# Patient Record
Sex: Male | Born: 1955
Health system: Southern US, Community
[De-identification: ages and names within clinical notes are randomized; demographics above are authoritative.]

## PROBLEM LIST (undated history)

## (undated) DIAGNOSIS — T4145XA Adverse effect of unspecified anesthetic, initial encounter: Secondary | ICD-10-CM

## (undated) DIAGNOSIS — D649 Anemia, unspecified: Secondary | ICD-10-CM

## (undated) DIAGNOSIS — Z8719 Personal history of other diseases of the digestive system: Secondary | ICD-10-CM

## (undated) DIAGNOSIS — G5601 Carpal tunnel syndrome, right upper limb: Secondary | ICD-10-CM

## (undated) DIAGNOSIS — Z8489 Family history of other specified conditions: Secondary | ICD-10-CM

## (undated) DIAGNOSIS — I10 Essential (primary) hypertension: Secondary | ICD-10-CM

## (undated) DIAGNOSIS — K219 Gastro-esophageal reflux disease without esophagitis: Secondary | ICD-10-CM

## (undated) DIAGNOSIS — M169 Osteoarthritis of hip, unspecified: Secondary | ICD-10-CM

## (undated) DIAGNOSIS — Z8619 Personal history of other infectious and parasitic diseases: Secondary | ICD-10-CM

## (undated) DIAGNOSIS — G5621 Lesion of ulnar nerve, right upper limb: Secondary | ICD-10-CM

## (undated) DIAGNOSIS — J45909 Unspecified asthma, uncomplicated: Secondary | ICD-10-CM

## (undated) DIAGNOSIS — E119 Type 2 diabetes mellitus without complications: Secondary | ICD-10-CM

## (undated) DIAGNOSIS — Z87442 Personal history of urinary calculi: Secondary | ICD-10-CM

## (undated) DIAGNOSIS — Z8711 Personal history of peptic ulcer disease: Secondary | ICD-10-CM

## (undated) HISTORY — DX: Personal history of other infectious and parasitic diseases: Z86.19

## (undated) HISTORY — PX: JOINT REPLACEMENT: SHX530

---

## 1999-01-30 DIAGNOSIS — T8859XA Other complications of anesthesia, initial encounter: Secondary | ICD-10-CM

## 1999-01-30 HISTORY — PX: SHOULDER ARTHROSCOPY: SHX128

## 1999-01-30 HISTORY — DX: Other complications of anesthesia, initial encounter: T88.59XA

## 1999-03-02 ENCOUNTER — Encounter: Admission: RE | Admit: 1999-03-02 | Discharge: 1999-03-02 | Payer: Self-pay | Admitting: Family Medicine

## 1999-03-02 ENCOUNTER — Encounter: Payer: Self-pay | Admitting: Family Medicine

## 1999-03-16 ENCOUNTER — Ambulatory Visit (HOSPITAL_COMMUNITY): Admission: RE | Admit: 1999-03-16 | Discharge: 1999-03-16 | Payer: Self-pay | Admitting: Gastroenterology

## 1999-03-17 ENCOUNTER — Encounter: Payer: Self-pay | Admitting: Gastroenterology

## 1999-03-17 ENCOUNTER — Encounter: Admission: RE | Admit: 1999-03-17 | Discharge: 1999-03-17 | Payer: Self-pay | Admitting: Gastroenterology

## 1999-11-10 ENCOUNTER — Ambulatory Visit (HOSPITAL_COMMUNITY): Admission: RE | Admit: 1999-11-10 | Discharge: 1999-11-10 | Payer: Self-pay | Admitting: Orthopedic Surgery

## 1999-11-10 ENCOUNTER — Encounter: Payer: Self-pay | Admitting: Orthopedic Surgery

## 2008-12-09 ENCOUNTER — Encounter (HOSPITAL_BASED_OUTPATIENT_CLINIC_OR_DEPARTMENT_OTHER): Admission: RE | Admit: 2008-12-09 | Discharge: 2008-12-30 | Payer: Self-pay | Admitting: Internal Medicine

## 2014-05-13 ENCOUNTER — Ambulatory Visit: Payer: Self-pay | Admitting: Orthopedic Surgery

## 2014-05-13 NOTE — Progress Notes (Signed)
Preoperative surgical orders have been place into the [facility] hospital system for Lawerance CruelWilliam R Kersh on 05/13/2014, 5:58 PM  by Patrica DuelPERKINS, Devonna Oboyle for surgery on 05-26-2014.  Preop Total Hip - Anterior Approach orders including IV Tylenol, and IV Decadron as long as there are no contraindications to the above medications. Avel Peacerew Makynzee Tigges, PA-C

## 2014-05-18 NOTE — Patient Instructions (Addendum)
Alan CruelWilliam R Butler  05/18/2014   Your procedure is scheduled on:  05/26/2014    [facility] and follow signs to               Short Stay Center at   0515 AM.  Call this number if you have problems the morning of surgery [phone]   Remember: Eat a good healthy snack prior to bedtime.   Do not eat food or drink liquids :After Midnight.     Take these medicines the morning of surgery with A SIP OF WATER:  Albuterol Inhaler if needed and bring, , Protonix                                You may not have any metal on your body including hair pins and              piercings  Do not wear jewelry,  lotions, powders or perfumes., deodorant.                            Men may shave face and neck.   Do not bring valuables to the hospital. [facility] IS NOT             RESPONSIBLE   FOR VALUABLES.  Contacts, dentures or bridgework may not be worn into surgery.  Leave suitcase in the car. After surgery it may be brought to your room.         Special Instructions: coughing and deep breathing exercises, leg exercises               Please read over the following fact sheets you were given: _____________________________________________________________________             [facility] - Preparing [facility] Before surgery, you can play an important role.  Because skin is not sterile, your skin needs to be as free of germs as possible.  You can reduce the number of germs on your skin by washing with CHG (chlorahexidine gluconate) soap before surgery.  CHG is an antiseptic cleaner which kills germs and bonds with the skin to continue killing germs even after washing. Please DO NOT use if you have an allergy to CHG or antibacterial soaps.  If your skin becomes reddened/irritated stop using the CHG and inform your nurse when you arrive at Short Stay. Do not shave (including legs and underarms) for at least 48 hours prior to the first CHG shower.  You  may shave your face/neck. Please follow these instructions carefully:  1.  Shower with CHG Soap the night before surgery and the  morning of Surgery.  2.  If you choose to wash your hair, wash your hair first as usual with your  normal  shampoo.  3.  After you shampoo, rinse your hair and body thoroughly to remove the  shampoo.                           4.  Use CHG as you would any other liquid soap.  You can apply chg directly  to the skin and wash                       Gently with  a scrungie or clean washcloth.  5.  Apply the CHG Soap to your body ONLY FROM THE NECK DOWN.   Do not use on face/ open                           Wound or open sores. Avoid contact with eyes, ears mouth and genitals (private parts).                       Wash face,  Genitals (private parts) with your normal soap.             6.  Wash thoroughly, paying special attention to the area where your surgery  will be performed.  7.  Thoroughly rinse your body with warm water from the neck down.  8.  DO NOT shower/wash with your normal soap after using and rinsing off  the CHG Soap.                9.  Pat yourself dry with a clean towel.            10.  Wear clean pajamas.            11.  Place clean sheets on your bed the night of your first shower and do not  sleep with pets. Day of Surgery : Do not apply any lotions/deodorants the morning of surgery.  Please wear clean clothes to the hospital/surgery center.  FAILURE TO FOLLOW THESE INSTRUCTIONS MAY RESULT IN THE CANCELLATION OF YOUR SURGERY PATIENT SIGNATURE_________________________________  NURSE SIGNATURE__________________________________  ________________________________________________________________________  WHAT IS A BLOOD TRANSFUSION? Blood Transfusion Information  A transfusion is the replacement of blood or some of its parts. Blood is made up of multiple cells which provide different functions.  Red blood cells carry oxygen and are used for blood loss  replacement.  White blood cells fight against infection.  Platelets control bleeding.  Plasma helps clot blood.  Other blood products are available for specialized needs, such as hemophilia or other clotting disorders. BEFORE THE TRANSFUSION  Who gives blood for transfusions?   Healthy volunteers who are fully evaluated to make sure their blood is safe. This is blood bank blood. Transfusion therapy is the safest it has ever been in the practice of medicine. Before blood is taken from a donor, a complete history is taken to make sure that person has no history of diseases nor engages in risky social behavior (examples are intravenous drug use or sexual activity with multiple partners). The donor's travel history is screened to minimize risk of transmitting infections, such as malaria. The donated blood is tested for signs of infectious diseases, such as HIV and hepatitis. The blood is then tested to be sure it is compatible with you in order to minimize the chance of a transfusion reaction. If you or a relative donates blood, this is often done in anticipation of surgery and is not appropriate for emergency situations. It takes many days to process the donated blood. RISKS AND COMPLICATIONS Although transfusion therapy is very safe and saves many lives, the main dangers of transfusion include:  1. Getting an infectious disease. 2. Developing a transfusion reaction. This is an allergic reaction to something in the blood you were given. Every precaution is taken to prevent this. The decision to have a blood transfusion has been considered carefully by your caregiver before blood is given. Blood is not given unless the benefits outweigh the risks.  AFTER THE TRANSFUSION  Right after receiving a blood transfusion, you will usually feel much better and more energetic. This is especially true if your red blood cells have gotten low (anemic). The transfusion raises the level of the red blood cells which  carry oxygen, and this usually causes an energy increase.  The nurse administering the transfusion will monitor you carefully for complications. HOME CARE INSTRUCTIONS  No special instructions are needed after a transfusion. You may find your energy is better. Speak with your caregiver about any limitations on activity for underlying diseases you may have. SEEK MEDICAL CARE IF:   Your condition is not improving after your transfusion.  You develop redness or irritation at the intravenous (IV) site. SEEK IMMEDIATE MEDICAL CARE IF:  Any of the following symptoms occur over the next 12 hours:  Shaking chills.  You have a temperature by mouth above 102 F (38.9 C), not controlled by medicine.  Chest, back, or muscle pain.  People around you feel you are not acting correctly or are confused.  Shortness of breath or difficulty breathing.  Dizziness and fainting.  You get a rash or develop hives.  You have a decrease in urine output.  Your urine turns a dark color or changes to pink, red, or brown. Any of the following symptoms occur over the next 10 days:  You have a temperature by mouth above 102 F (38.9 C), not controlled by medicine.  Shortness of breath.  Weakness after normal activity.  The white part of the eye turns yellow (jaundice).  You have a decrease in the amount of urine or are urinating less often.  Your urine turns a dark color or changes to pink, red, or brown. Document Released: 01/13/2000 Document Revised: 04/09/2011 Document Reviewed: 09/01/2007 ExitCare Patient Information 2014 [place].  _______________________________________________________________________  Incentive Spirometer  An incentive spirometer is a tool that can help keep your lungs clear and active. This tool measures how well you are filling your lungs with each breath. Taking long deep breaths may help reverse or decrease the chance of developing breathing (pulmonary) problems  (especially infection) following:  A long period of time when you are unable to move or be active. BEFORE THE PROCEDURE   If the spirometer includes an indicator to show your best effort, your nurse or respiratory therapist will set it to a desired goal.  If possible, sit up straight or lean slightly forward. Try not to slouch.  Hold the incentive spirometer in an upright position. INSTRUCTIONS FOR USE  3. Sit on the edge of your bed if possible, or sit up as far as you can in bed or on a chair. 4. Hold the incentive spirometer in an upright position. 5. Breathe out normally. 6. Place the mouthpiece in your mouth and seal your lips tightly around it. 7. Breathe in slowly and as deeply as possible, raising the piston or the ball toward the top of the column. 8. Hold your breath for 3-5 seconds or for as long as possible. Allow the piston or ball to fall to the bottom of the column. 9. Remove the mouthpiece from your mouth and breathe out normally. 10. Rest for a few seconds and repeat Steps 1 through 7 at least 10 times every 1-2 hours when you are awake. Take your time and take a few normal breaths between deep breaths. 11. The spirometer may include an indicator to show your best effort. Use the indicator as a goal to work toward during  each repetition. 12. After each set of 10 deep breaths, practice coughing to be sure your lungs are clear. If you have an incision (the cut made at the time of surgery), support your incision when coughing by placing a pillow or rolled up towels firmly against it. Once you are able to get out of bed, walk around indoors and cough well. You may stop using the incentive spirometer when instructed by your caregiver.  RISKS AND COMPLICATIONS  Take your time so you do not get dizzy or light-headed.  If you are in pain, you may need to take or ask for pain medication before doing incentive spirometry. It is harder to take a deep breath if you are having  pain. AFTER USE  Rest and breathe slowly and easily.  It can be helpful to keep track of a log of your progress. Your caregiver can provide you with a simple table to help with this. If you are using the spirometer at home, follow these instructions: [place] IF:   You are having difficultly using the spirometer.  You have trouble using the spirometer as often as instructed.  Your pain medication is not giving enough relief while using the spirometer.  You develop fever of 100.5 F (38.1 C) or higher. SEEK IMMEDIATE MEDICAL CARE IF:   You cough up bloody sputum that had not been present before.  You develop fever of 102 F (38.9 C) or greater.  You develop worsening pain at or near the incision site. MAKE SURE YOU:   Understand these instructions.  Will watch your condition.  Will get help right away if you are not doing well or get worse. Document Released: 05/28/2006 Document Revised: 04/09/2011 Document Reviewed: 07/29/2006 [facility] Patient Information 2014 [place], [place].   ________________________________________________________________________

## 2014-05-19 ENCOUNTER — Encounter (HOSPITAL_COMMUNITY): Payer: Self-pay

## 2014-05-19 ENCOUNTER — Encounter (HOSPITAL_COMMUNITY)
Admission: RE | Admit: 2014-05-19 | Discharge: 2014-05-19 | Disposition: A | Discharge status: Home / Self Care | Payer: 59 | Source: Ambulatory Visit | Attending: Orthopedic Surgery | Admitting: Orthopedic Surgery

## 2014-05-19 DIAGNOSIS — Z01812 Encounter for preprocedural laboratory examination: Secondary | ICD-10-CM | POA: Insufficient documentation

## 2014-05-19 HISTORY — DX: Gastro-esophageal reflux disease without esophagitis: K21.9

## 2014-05-19 HISTORY — DX: Family history of other specified conditions: Z84.89

## 2014-05-19 HISTORY — DX: Unspecified asthma, uncomplicated: J45.909

## 2014-05-19 HISTORY — DX: Personal history of urinary calculi: Z87.442

## 2014-05-19 HISTORY — DX: Adverse effect of unspecified anesthetic, initial encounter: T41.45XA

## 2014-05-19 HISTORY — DX: Essential (primary) hypertension: I10

## 2014-05-19 LAB — URINALYSIS, ROUTINE W REFLEX MICROSCOPIC
Bilirubin Urine: NEGATIVE
Glucose, UA: NEGATIVE mg/dL
KETONES UR: NEGATIVE mg/dL
LEUKOCYTES UA: NEGATIVE
Nitrite: NEGATIVE
Protein, ur: NEGATIVE mg/dL
Specific Gravity, Urine: 1.01 (ref 1.005–1.030)
UROBILINOGEN UA: 1 mg/dL (ref 0.0–1.0)
pH: 6 (ref 5.0–8.0)

## 2014-05-19 LAB — PROTIME-INR
INR: 0.97 (ref 0.00–1.49)
Prothrombin Time: 13 seconds (ref 11.6–15.2)

## 2014-05-19 LAB — COMPREHENSIVE METABOLIC PANEL
ALT: 19 U/L (ref 0–53)
AST: 23 U/L (ref 0–37)
Albumin: 4.6 g/dL (ref 3.5–5.2)
Alkaline Phosphatase: 62 U/L (ref 39–117)
Anion gap: 8 (ref 5–15)
BUN: 15 mg/dL (ref 6–23)
CALCIUM: 9.7 mg/dL (ref 8.4–10.5)
CO2: 28 mmol/L (ref 19–32)
Chloride: 104 mmol/L (ref 96–112)
Creatinine, Ser: 0.74 mg/dL (ref 0.50–1.35)
GLUCOSE: 158 mg/dL — AB (ref 70–99)
Potassium: 4.2 mmol/L (ref 3.5–5.1)
SODIUM: 140 mmol/L (ref 135–145)
Total Bilirubin: 1 mg/dL (ref 0.3–1.2)
Total Protein: 7.7 g/dL (ref 6.0–8.3)

## 2014-05-19 LAB — CBC
HCT: 43.2 % (ref 39.0–52.0)
Hemoglobin: 13.8 g/dL (ref 13.0–17.0)
MCH: 31.4 pg (ref 26.0–34.0)
MCHC: 31.9 g/dL (ref 30.0–36.0)
MCV: 98.2 fL (ref 78.0–100.0)
Platelets: 172 10*3/uL (ref 150–400)
RBC: 4.4 MIL/uL (ref 4.22–5.81)
RDW: 13.1 % (ref 11.5–15.5)
WBC: 6.2 10*3/uL (ref 4.0–10.5)

## 2014-05-19 LAB — URINE MICROSCOPIC-ADD ON

## 2014-05-19 LAB — SURGICAL PCR SCREEN
MRSA, PCR: POSITIVE — AB
Staphylococcus aureus: POSITIVE — AB

## 2014-05-19 LAB — APTT: APTT: 31 s (ref 24–37)

## 2014-05-19 NOTE — Progress Notes (Signed)
U/A with micro results done 05/19/14 faxed via EPIC to Dr Aluisio.   

## 2014-05-19 NOTE — Progress Notes (Signed)
EKG- 03/31/14 on chart  Clearance- Dr Tomi BambergerSusan Fuller 04/09/14 on chart along with LOV- 03/31/14 on chart.

## 2014-05-23 ENCOUNTER — Ambulatory Visit: Payer: Self-pay | Admitting: Orthopedic Surgery

## 2014-05-23 NOTE — H&P (Signed)
Alan Butler DOB: 10/21/1955 Married / Language: English / Race: White Male Date of Admission:  05/26/2014 CC:  Right Hip Pain History of Present Illness The patient is a 58 year old male who comes in  for a preoperative History and Physical. The patient is scheduled for a right total hip arthroplasty (anterior) to be performed by Dr. Frank V. Aluisio, MD at [facility] on 05-26-2014. The patient is a 58 year old male who presents with a hip problem. The patient was seen for a second opinion.The patient reports right hip problems including pain and osteoarthritis symptoms that have been present for year(s). The symptoms began without any known injury. Prior to being seen today the patient was previously evaluated by a colleague (by Dr. Wainer) 10 year(s) ago. Symptoms present at the patient's previous evaluation included pain in the hip. Previous workup for this problem has included pelvic x-rays, hip x-rays and hip MRI. He is having pain and decreased ROM. When he was seen by Dr. Wainer 10 years ago he was told that he had some OA in hie right hip but that he was too young for THA. He was placed on meloxicam which his PCP quickly took him off of. He takes Aleve currently with minimal benefit. The pain has gotten progressively worse over the years. He has pain at all times but worse with weightbearing. His pain is in the groin and lateral right thigh, and radiates to the knee. 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 He is ready to have the hip replaced. Unfortunately, the right hip has gotten to the point where it is hurting him at all times. It is limiting what he can and cannot do. He has been told many years ago that he would eventually need to have the hip replaced but now he's at a stage where he cannot tolerate the pain anymore. It is affecting him day and night. It's affecting his ability to work. He is ready to proceed with  surgery. They have been treated conservatively in the past for the above stated problem and despite conservative measures, they continue to have progressive pain and severe functional limitations and dysfunction. They have failed non-operative management including home exercise, medications, and injections. It is felt that they would benefit from undergoing total joint replacement. Risks and benefits of the procedure have been discussed with the patient and they elect to proceed with surgery. There are no active contraindications to surgery such as ongoing infection or rapidly progressive neurological disease.  Problem List/Past Medical Primary osteoarthritis of right hip (M16.11) Hypercholesterolemia Skin Cancer Diabetes Mellitus, Type II Gastroesophageal Reflux Disease High blood pressure Allergic Urticaria Asthma Pneumonia Past History Hemorrhoids Kidney Stone Cellulitis Past History Staphlococcus Infections around 59 years old  Allergies No Known Drug Allergies  Family History  Congestive Heart Failure mother Diabetes Mellitus mother, grandmother mothers side and grandfather mothers side Heart Disease mother, grandmother mothers side and grandfather mothers side Cerebrovascular Accident grandfather fathers side Hypertension mother  Social History Children 1 Current work status working full time Drug/Alcohol Rehab (Currently) no Alcohol use current drinker; only occasionally per week Drug/Alcohol Rehab (Previously) no Pain Contract no Tobacco use never smoker Illicit drug use no Living situation live with spouse Marital status married Advance Directives Living Will, Healthcare POA  Medication History  Multivitamin (Oral) Active. MetFORMIN HCl (1000MG Tablet, Oral two times daily) Active. Fosinopril Sodium (40MG Tablet, Oral daily)   Active. Pantoprazole Sodium (40MG Tablet DR, Oral two times daily) Active. Simvastatin (40MG Tablet, Oral  at bedtime) Active. Naproxen Sodium (220MG Tablet, Oral as needed) Active. Aspirin EC (81MG Tablet DR, Oral daily) Active. ZyrTEC Allergy (10MG Tablet, Oral daily) Active. (generic brand from [employer]) Clobetasol Propionate (0.05% Cream, External as needed) Active.  Past Surgical History  Arthroscopy of Shoulder right Tonsillectomy  Review of Systems General Not Present- Chills, Fatigue, Fever, Memory Loss, Night Sweats, Weight Gain and Weight Loss. Skin Not Present- Eczema, Hives, Itching, Lesions and Rash. HEENT Not Present- Dentures, Double Vision, Headache, Hearing Loss, Tinnitus and Visual Loss. Respiratory Not Present- Allergies, Chronic Cough, Coughing up blood, Shortness of breath at rest and Shortness of breath with exertion. Cardiovascular Not Present- Chest Pain, Difficulty Breathing Lying Down, Murmur, Palpitations, Racing/skipping heartbeats and Swelling. Gastrointestinal Present- Diarrhea (attributed to the metformin). Not Present- Abdominal Pain, Bloody Stool, Constipation, Difficulty Swallowing, Heartburn, Jaundice, Loss of appetitie, Nausea and Vomiting. Male Genitourinary Not Present- Blood in Urine, Discharge, Flank Pain, Incontinence, Painful Urination, Urgency, Urinary frequency, Urinary Retention, Urinating at Night and Weak urinary stream. Musculoskeletal Not Present- Back Pain, Joint Pain, Joint Swelling, Morning Stiffness, Muscle Pain, Muscle Weakness and Spasms. Neurological Not Present- Blackout spells, Difficulty with balance, Dizziness, Paralysis, Tremor and Weakness. Psychiatric Not Present- Insomnia.  Vitals Weight: 189 lb Height: 70in Weight was reported by patient. Height was reported by patient. Body Surface Area: 2.04 m Body Mass Index: 27.12 kg/m  BP: 122/78 (Sitting, Right Arm, Standard)  Physical Exam  General Mental Status -Alert, cooperative and good historian. General Appearance-pleasant, Not in acute  distress. Orientation-Oriented X3. Build & Nutrition-Well nourished and Well developed.  Head and Neck Head-normocephalic, atraumatic . Neck Global Assessment - supple, no bruit auscultated on the right, no bruit auscultated on the left.  Eye Vision-Wears corrective lenses. Pupil - Bilateral-Regular and Round. Motion - Bilateral-EOMI.  Chest and Lung Exam Auscultation Breath sounds - clear at anterior chest wall and clear at posterior chest wall. Adventitious sounds - No Adventitious sounds.  Cardiovascular Auscultation Rhythm - Regular rate and rhythm. Heart Sounds - S1 WNL and S2 WNL. Murmurs & Other Heart Sounds - Auscultation of the heart reveals - No Murmurs.  Abdomen Palpation/Percussion Tenderness - Abdomen is non-tender to palpation. Rigidity (guarding) - Abdomen is soft. Auscultation Auscultation of the abdomen reveals - Bowel sounds normal.  Male Genitourinary Note: Not done, not pertinent to present illness   Musculoskeletal Note: On exam, well-developed male, A&O, in no apparent distress. His left hip has normal motion without discomfort. Right hip has flexion to 80, no internal rotation, minimal external rotation, only about 15 degrees of abduction. There is pain on all ranges of motion. His knee shows no effusion, range about 0-125 with no tenderness or instability. Pulses, sensation and motor are intact distally. He has a significantly antalgic gait pattern on the right.  RADIOGRAPHS: AP pelvis and lateral of the right hip show that he has advanced end stage arthritis, bone on bone throughout with subchondral cystic formation and large osteophytes. Left hip has slight degenerative change.   Assessment & Plan Primary osteoarthritis of right hip (M16.11) Note:Surgical Plans: Right Total Hip Replacement - Anterior Approach  Disposition: HOME  PCP: Susan Fuller, NP - Patient has been seen preoperatively and felt to be stable for  surgery.  IV TXA  Anesthesia Issues: Difficult to wake up following the shoulder surgery.  Signed electronically by Leea Rambeau L Leotta Weingarten, III PA-C 

## 2014-05-25 MED ORDER — CEFAZOLIN SODIUM-DEXTROSE 2-3 GM-% IV SOLR: 2.0000 g | INTRAVENOUS | Status: DC

## 2014-05-25 NOTE — Anesthesia Preprocedure Evaluation (Addendum)
Anesthesia Evaluation  Patient identified by MRN, date of birth, ID band Patient awake    Reviewed: Allergy & Precautions, H&P , NPO status , Patient's Chart, lab work & pertinent test results  Airway Mallampati: II  TM Distance: >3 FB Neck ROM: full    Dental no notable dental hx. (+) Teeth Intact, Dental Advisory Given   Pulmonary neg pulmonary ROS,  breath sounds clear to auscultation  Pulmonary exam normal       Cardiovascular Exercise Tolerance: Good hypertension, Pt. on medications Rhythm:regular Rate:Normal     Neuro/Psych negative neurological ROS  negative psych ROS   GI/Hepatic negative GI ROS, Neg liver ROS, GERD-  Medicated and Controlled,  Endo/Other  diabetes, Well Controlled, Type 2, Oral Hypoglycemic Agents  Renal/GU negative Renal ROS  negative genitourinary   Musculoskeletal   Abdominal   Peds  Hematology negative hematology ROS (+)   Anesthesia Other Findings   Reproductive/Obstetrics negative OB ROS                            Anesthesia Physical Anesthesia Plan  ASA: III  Anesthesia Plan: General   Post-op Pain Management:    Induction: Intravenous  Airway Management Planned: Oral ETT  Additional Equipment:   Intra-op Plan:   Post-operative Plan: Extubation in OR  Informed Consent: I have reviewed the patients History and Physical, chart, labs and discussed the procedure including the risks, benefits and alternatives for the proposed anesthesia with the patient or authorized representative who has indicated his/her understanding and acceptance.   Dental Advisory Given  Plan Discussed with: CRNA and Surgeon  Anesthesia Plan Comments:         Anesthesia Quick Evaluation  

## 2014-05-26 ENCOUNTER — Inpatient Hospital Stay (HOSPITAL_COMMUNITY): Payer: 59

## 2014-05-26 ENCOUNTER — Inpatient Hospital Stay (HOSPITAL_COMMUNITY): Payer: 59 | Admitting: Anesthesiology

## 2014-05-26 ENCOUNTER — Encounter (HOSPITAL_COMMUNITY): Payer: Self-pay | Admitting: *Deleted

## 2014-05-26 ENCOUNTER — Encounter (HOSPITAL_COMMUNITY)
Admission: RE | Disposition: A | Discharge status: Home / Self Care | Payer: Self-pay | Source: Ambulatory Visit | Attending: Orthopedic Surgery

## 2014-05-26 ENCOUNTER — Inpatient Hospital Stay (HOSPITAL_COMMUNITY)
Admission: RE | Admit: 2014-05-26 | Discharge: 2014-05-28 | DRG: 470 | Disposition: A | Discharge status: Home / Self Care | Payer: 59 | Source: Ambulatory Visit | Attending: Orthopedic Surgery | Admitting: Orthopedic Surgery

## 2014-05-26 DIAGNOSIS — M1611 Unilateral primary osteoarthritis, right hip: Secondary | ICD-10-CM | POA: Diagnosis present

## 2014-05-26 DIAGNOSIS — Z85828 Personal history of other malignant neoplasm of skin: Secondary | ICD-10-CM | POA: Diagnosis not present

## 2014-05-26 DIAGNOSIS — E780 Pure hypercholesterolemia: Secondary | ICD-10-CM | POA: Diagnosis present

## 2014-05-26 DIAGNOSIS — E119 Type 2 diabetes mellitus without complications: Secondary | ICD-10-CM | POA: Diagnosis present

## 2014-05-26 DIAGNOSIS — J45909 Unspecified asthma, uncomplicated: Secondary | ICD-10-CM | POA: Diagnosis present

## 2014-05-26 DIAGNOSIS — I10 Essential (primary) hypertension: Secondary | ICD-10-CM | POA: Diagnosis present

## 2014-05-26 DIAGNOSIS — L500 Allergic urticaria: Secondary | ICD-10-CM | POA: Diagnosis present

## 2014-05-26 DIAGNOSIS — K219 Gastro-esophageal reflux disease without esophagitis: Secondary | ICD-10-CM | POA: Diagnosis present

## 2014-05-26 DIAGNOSIS — M25551 Pain in right hip: Secondary | ICD-10-CM | POA: Diagnosis present

## 2014-05-26 DIAGNOSIS — Z96649 Presence of unspecified artificial hip joint: Secondary | ICD-10-CM

## 2014-05-26 DIAGNOSIS — N200 Calculus of kidney: Secondary | ICD-10-CM | POA: Diagnosis present

## 2014-05-26 DIAGNOSIS — M169 Osteoarthritis of hip, unspecified: Secondary | ICD-10-CM | POA: Diagnosis present

## 2014-05-26 DIAGNOSIS — Z87442 Personal history of urinary calculi: Secondary | ICD-10-CM | POA: Diagnosis not present

## 2014-05-26 HISTORY — PX: TOTAL HIP ARTHROPLASTY: SHX124

## 2014-05-26 LAB — GLUCOSE, CAPILLARY
GLUCOSE-CAPILLARY: 128 mg/dL — AB (ref 70–99)
GLUCOSE-CAPILLARY: 148 mg/dL — AB (ref 70–99)
Glucose-Capillary: 144 mg/dL — ABNORMAL HIGH (ref 70–99)
Glucose-Capillary: 160 mg/dL — ABNORMAL HIGH (ref 70–99)

## 2014-05-26 LAB — ABO/RH: ABO/RH(D): O POS

## 2014-05-26 LAB — TYPE AND SCREEN
ABO/RH(D): O POS
Antibody Screen: NEGATIVE

## 2014-05-26 SURGERY — ARTHROPLASTY, HIP, TOTAL, ANTERIOR APPROACH
Anesthesia: General | Laterality: Right

## 2014-05-26 MED ORDER — ONDANSETRON HCL 4 MG/2ML IJ SOLN
4.0000 mg | Freq: Four times a day (QID) | INTRAMUSCULAR | Status: DC | PRN
  Administered 2014-05-27: 4 mg via INTRAVENOUS
  Filled 2014-05-26: qty 2

## 2014-05-26 MED ORDER — NEOSTIGMINE METHYLSULFATE 10 MG/10ML IV SOLN
INTRAVENOUS | Status: AC
  Filled 2014-05-26: qty 1

## 2014-05-26 MED ORDER — GLYCOPYRROLATE 0.2 MG/ML IJ SOLN
INTRAMUSCULAR | Status: DC | PRN
  Administered 2014-05-26: .8 mg via INTRAVENOUS

## 2014-05-26 MED ORDER — KETOROLAC TROMETHAMINE 15 MG/ML IJ SOLN: 7.5000 mg | Freq: Four times a day (QID) | INTRAMUSCULAR | Status: AC | PRN

## 2014-05-26 MED ORDER — MORPHINE SULFATE 2 MG/ML IJ SOLN
1.0000 mg | INTRAMUSCULAR | Status: DC | PRN
  Administered 2014-05-26: 2 mg via INTRAVENOUS
  Administered 2014-05-26: 1 mg via INTRAVENOUS
  Filled 2014-05-26 (×3): qty 1

## 2014-05-26 MED ORDER — EPHEDRINE SULFATE 50 MG/ML IJ SOLN
INTRAMUSCULAR | Status: AC
  Filled 2014-05-26: qty 1

## 2014-05-26 MED ORDER — DEXAMETHASONE SODIUM PHOSPHATE 10 MG/ML IJ SOLN
10.0000 mg | Freq: Once | INTRAMUSCULAR | Status: AC
  Administered 2014-05-26: 8 mg via INTRAVENOUS

## 2014-05-26 MED ORDER — HYDROMORPHONE HCL 2 MG/ML IJ SOLN
INTRAMUSCULAR | Status: AC
Start: 2014-05-26 — End: 2014-05-26
  Filled 2014-05-26: qty 1

## 2014-05-26 MED ORDER — RIVAROXABAN 10 MG PO TABS
10.0000 mg | ORAL_TABLET | Freq: Every day | ORAL | Status: DC
  Administered 2014-05-27 – 2014-05-28 (×2): 10 mg via ORAL
  Filled 2014-05-26 (×3): qty 1

## 2014-05-26 MED ORDER — LIDOCAINE HCL (CARDIAC) 20 MG/ML IV SOLN
INTRAVENOUS | Status: AC
  Filled 2014-05-26: qty 5

## 2014-05-26 MED ORDER — ONDANSETRON HCL 4 MG PO TABS: 4.0000 mg | ORAL_TABLET | Freq: Four times a day (QID) | ORAL | Status: DC | PRN

## 2014-05-26 MED ORDER — DIPHENHYDRAMINE HCL 12.5 MG/5ML PO ELIX: 12.5000 mg | ORAL_SOLUTION | ORAL | Status: DC | PRN

## 2014-05-26 MED ORDER — CEFAZOLIN SODIUM-DEXTROSE 2-3 GM-% IV SOLR
2.0000 g | Freq: Four times a day (QID) | INTRAVENOUS | Status: AC
  Administered 2014-05-26 (×2): 2 g via INTRAVENOUS
  Filled 2014-05-26 (×2): qty 50

## 2014-05-26 MED ORDER — MIDAZOLAM HCL 2 MG/2ML IJ SOLN
INTRAMUSCULAR | Status: AC
  Filled 2014-05-26: qty 2

## 2014-05-26 MED ORDER — CHLORHEXIDINE GLUCONATE 4 % EX LIQD: 60.0000 mL | Freq: Once | CUTANEOUS | Status: DC

## 2014-05-26 MED ORDER — BISACODYL 10 MG RE SUPP: 10.0000 mg | Freq: Every day | RECTAL | Status: DC | PRN

## 2014-05-26 MED ORDER — BUPIVACAINE HCL (PF) 0.25 % IJ SOLN
INTRAMUSCULAR | Status: AC
  Filled 2014-05-26: qty 30

## 2014-05-26 MED ORDER — SIMVASTATIN 40 MG PO TABS
40.0000 mg | ORAL_TABLET | Freq: Every day | ORAL | Status: DC
  Administered 2014-05-27 – 2014-05-28 (×2): 40 mg via ORAL
  Filled 2014-05-26 (×2): qty 1

## 2014-05-26 MED ORDER — HYDROMORPHONE HCL 1 MG/ML IJ SOLN
INTRAMUSCULAR | Status: DC | PRN
  Administered 2014-05-26 (×2): 0.5 mg via INTRAVENOUS

## 2014-05-26 MED ORDER — ONDANSETRON HCL 4 MG/2ML IJ SOLN
INTRAMUSCULAR | Status: AC
  Filled 2014-05-26: qty 2

## 2014-05-26 MED ORDER — FENTANYL CITRATE (PF) 100 MCG/2ML IJ SOLN
INTRAMUSCULAR | Status: DC | PRN
  Administered 2014-05-26 (×4): 50 ug via INTRAVENOUS

## 2014-05-26 MED ORDER — ACETAMINOPHEN 10 MG/ML IV SOLN
1000.0000 mg | Freq: Once | INTRAVENOUS | Status: AC
  Administered 2014-05-26: 1000 mg via INTRAVENOUS
  Filled 2014-05-26: qty 100

## 2014-05-26 MED ORDER — DEXAMETHASONE SODIUM PHOSPHATE 10 MG/ML IJ SOLN
INTRAMUSCULAR | Status: AC
  Filled 2014-05-26: qty 1

## 2014-05-26 MED ORDER — NEOSTIGMINE METHYLSULFATE 10 MG/10ML IV SOLN
INTRAVENOUS | Status: DC | PRN
  Administered 2014-05-26: 4 mg via INTRAVENOUS

## 2014-05-26 MED ORDER — DEXAMETHASONE SODIUM PHOSPHATE 10 MG/ML IJ SOLN
10.0000 mg | Freq: Once | INTRAMUSCULAR | Status: AC
  Administered 2014-05-27: 10 mg via INTRAVENOUS
  Filled 2014-05-26: qty 1

## 2014-05-26 MED ORDER — FLEET ENEMA 7-19 GM/118ML RE ENEM: 1.0000 | ENEMA | Freq: Once | RECTAL | Status: AC | PRN

## 2014-05-26 MED ORDER — MENTHOL 3 MG MT LOZG: 1.0000 | LOZENGE | OROMUCOSAL | Status: DC | PRN

## 2014-05-26 MED ORDER — FENTANYL CITRATE (PF) 100 MCG/2ML IJ SOLN
INTRAMUSCULAR | Status: AC
  Filled 2014-05-26: qty 2

## 2014-05-26 MED ORDER — VANCOMYCIN HCL IN DEXTROSE 1-5 GM/200ML-% IV SOLN
1000.0000 mg | Freq: Once | INTRAVENOUS | Status: AC
  Administered 2014-05-26: 1000 mg via INTRAVENOUS
  Filled 2014-05-26 (×2): qty 200

## 2014-05-26 MED ORDER — POLYETHYLENE GLYCOL 3350 17 G PO PACK
17.0000 g | PACK | Freq: Every day | ORAL | Status: DC | PRN
  Administered 2014-05-28: 17 g via ORAL
  Filled 2014-05-26: qty 1

## 2014-05-26 MED ORDER — ROCURONIUM BROMIDE 100 MG/10ML IV SOLN
INTRAVENOUS | Status: DC | PRN
  Administered 2014-05-26: 45 mg via INTRAVENOUS

## 2014-05-26 MED ORDER — HYDROMORPHONE HCL 1 MG/ML IJ SOLN
0.2500 mg | INTRAMUSCULAR | Status: DC | PRN
  Administered 2014-05-26: 0.5 mg via INTRAVENOUS

## 2014-05-26 MED ORDER — LIDOCAINE HCL (CARDIAC) 20 MG/ML IV SOLN
INTRAVENOUS | Status: DC | PRN
  Administered 2014-05-26: 100 mg via INTRAVENOUS

## 2014-05-26 MED ORDER — ACETAMINOPHEN 500 MG PO TABS
1000.0000 mg | ORAL_TABLET | Freq: Four times a day (QID) | ORAL | Status: AC
  Administered 2014-05-26 – 2014-05-27 (×3): 1000 mg via ORAL
  Filled 2014-05-26 (×4): qty 2

## 2014-05-26 MED ORDER — PHENOL 1.4 % MT LIQD: 1.0000 | OROMUCOSAL | Status: DC | PRN

## 2014-05-26 MED ORDER — ACETAMINOPHEN 650 MG RE SUPP: 650.0000 mg | Freq: Four times a day (QID) | RECTAL | Status: DC | PRN

## 2014-05-26 MED ORDER — 0.9 % SODIUM CHLORIDE (POUR BTL) OPTIME
TOPICAL | Status: DC | PRN
  Administered 2014-05-26: 1000 mL

## 2014-05-26 MED ORDER — LORATADINE 10 MG PO TABS
10.0000 mg | ORAL_TABLET | Freq: Every day | ORAL | Status: DC
  Administered 2014-05-27 – 2014-05-28 (×2): 10 mg via ORAL
  Filled 2014-05-26 (×2): qty 1

## 2014-05-26 MED ORDER — METHOCARBAMOL 1000 MG/10ML IJ SOLN
500.0000 mg | Freq: Four times a day (QID) | INTRAVENOUS | Status: DC | PRN
  Administered 2014-05-26: 500 mg via INTRAVENOUS
  Filled 2014-05-26 (×3): qty 5

## 2014-05-26 MED ORDER — METHOCARBAMOL 500 MG PO TABS
500.0000 mg | ORAL_TABLET | Freq: Four times a day (QID) | ORAL | Status: DC | PRN
  Administered 2014-05-26 – 2014-05-28 (×4): 500 mg via ORAL
  Filled 2014-05-26 (×5): qty 1

## 2014-05-26 MED ORDER — METOCLOPRAMIDE HCL 10 MG PO TABS: 5.0000 mg | ORAL_TABLET | Freq: Three times a day (TID) | ORAL | Status: DC | PRN

## 2014-05-26 MED ORDER — GLYCOPYRROLATE 0.2 MG/ML IJ SOLN
INTRAMUSCULAR | Status: AC
  Filled 2014-05-26: qty 4

## 2014-05-26 MED ORDER — TRANEXAMIC ACID 1000 MG/10ML IV SOLN
1000.0000 mg | INTRAVENOUS | Status: AC
  Administered 2014-05-26: 1000 mg via INTRAVENOUS
  Filled 2014-05-26: qty 10

## 2014-05-26 MED ORDER — PANTOPRAZOLE SODIUM 40 MG PO TBEC
40.0000 mg | DELAYED_RELEASE_TABLET | Freq: Every day | ORAL | Status: DC
  Administered 2014-05-27 – 2014-05-28 (×2): 40 mg via ORAL
  Filled 2014-05-26 (×2): qty 1

## 2014-05-26 MED ORDER — TRAMADOL HCL 50 MG PO TABS: 50.0000 mg | ORAL_TABLET | Freq: Four times a day (QID) | ORAL | Status: DC | PRN

## 2014-05-26 MED ORDER — PROPOFOL 10 MG/ML IV BOLUS
INTRAVENOUS | Status: AC
  Filled 2014-05-26: qty 20

## 2014-05-26 MED ORDER — METOCLOPRAMIDE HCL 5 MG/ML IJ SOLN: 5.0000 mg | Freq: Three times a day (TID) | INTRAMUSCULAR | Status: DC | PRN

## 2014-05-26 MED ORDER — ALBUTEROL SULFATE (2.5 MG/3ML) 0.083% IN NEBU: 3.0000 mL | INHALATION_SOLUTION | Freq: Four times a day (QID) | RESPIRATORY_TRACT | Status: DC | PRN

## 2014-05-26 MED ORDER — HYDROMORPHONE HCL 1 MG/ML IJ SOLN
INTRAMUSCULAR | Status: AC
Start: 2014-05-26 — End: 2014-05-26
  Filled 2014-05-26: qty 1

## 2014-05-26 MED ORDER — METFORMIN HCL 500 MG PO TABS
1000.0000 mg | ORAL_TABLET | Freq: Two times a day (BID) | ORAL | Status: DC
  Filled 2014-05-26 (×3): qty 2

## 2014-05-26 MED ORDER — LACTATED RINGERS IV SOLN
INTRAVENOUS | Status: DC
  Administered 2014-05-26: 07:00:00 via INTRAVENOUS

## 2014-05-26 MED ORDER — LACTATED RINGERS IV SOLN: INTRAVENOUS | Status: DC

## 2014-05-26 MED ORDER — MIDAZOLAM HCL 5 MG/5ML IJ SOLN
INTRAMUSCULAR | Status: DC | PRN
  Administered 2014-05-26: 1 mg via INTRAVENOUS

## 2014-05-26 MED ORDER — SODIUM CHLORIDE 0.9 % IV SOLN: INTRAVENOUS | Status: DC

## 2014-05-26 MED ORDER — DOCUSATE SODIUM 100 MG PO CAPS
100.0000 mg | ORAL_CAPSULE | Freq: Two times a day (BID) | ORAL | Status: DC
  Administered 2014-05-27 – 2014-05-28 (×3): 100 mg via ORAL

## 2014-05-26 MED ORDER — SODIUM CHLORIDE 0.9 % IV SOLN
INTRAVENOUS | Status: DC
  Administered 2014-05-26 – 2014-05-27 (×3): via INTRAVENOUS

## 2014-05-26 MED ORDER — BUPIVACAINE HCL (PF) 0.25 % IJ SOLN
INTRAMUSCULAR | Status: DC | PRN
  Administered 2014-05-26: 30 mL

## 2014-05-26 MED ORDER — SODIUM CHLORIDE 0.9 % IJ SOLN
INTRAMUSCULAR | Status: AC
  Filled 2014-05-26: qty 10

## 2014-05-26 MED ORDER — OXYCODONE HCL 5 MG PO TABS
5.0000 mg | ORAL_TABLET | ORAL | Status: DC | PRN
  Administered 2014-05-26: 5 mg via ORAL
  Administered 2014-05-26 – 2014-05-28 (×6): 10 mg via ORAL
  Filled 2014-05-26 (×2): qty 1
  Filled 2014-05-26 (×7): qty 2

## 2014-05-26 MED ORDER — ONDANSETRON HCL 4 MG/2ML IJ SOLN
INTRAMUSCULAR | Status: DC | PRN
  Administered 2014-05-26: 4 mg via INTRAVENOUS

## 2014-05-26 MED ORDER — PROPOFOL 10 MG/ML IV BOLUS
INTRAVENOUS | Status: DC | PRN
  Administered 2014-05-26: 250 mg via INTRAVENOUS

## 2014-05-26 MED ORDER — ACETAMINOPHEN 325 MG PO TABS: 650.0000 mg | ORAL_TABLET | Freq: Four times a day (QID) | ORAL | Status: DC | PRN

## 2014-05-26 MED ORDER — INSULIN ASPART 100 UNIT/ML ~~LOC~~ SOLN
0.0000 [IU] | Freq: Three times a day (TID) | SUBCUTANEOUS | Status: DC
  Administered 2014-05-27: 3 [IU] via SUBCUTANEOUS
  Administered 2014-05-27 – 2014-05-28 (×2): 2 [IU] via SUBCUTANEOUS

## 2014-05-26 SURGICAL SUPPLY — 43 items
BAG DECANTER FOR FLEXI CONT (MISCELLANEOUS) ×3 IMPLANT
BAG SPEC THK2 15X12 ZIP CLS (MISCELLANEOUS)
BAG ZIPLOCK 12X15 (MISCELLANEOUS) IMPLANT
BLADE EXTENDED COATED 6.5IN (ELECTRODE) ×3 IMPLANT
BLADE SAG 18X100X1.27 (BLADE) ×3 IMPLANT
CAPT HIP TOTAL 2 ×2 IMPLANT
CLOSURE WOUND 1/2 X4 (GAUZE/BANDAGES/DRESSINGS) ×1
COVER PERINEAL POST (MISCELLANEOUS) ×3 IMPLANT
DECANTER SPIKE VIAL GLASS SM (MISCELLANEOUS) ×3 IMPLANT
DRAPE C-ARM 42X120 X-RAY (DRAPES) ×3 IMPLANT
DRAPE STERI IOBAN 125X83 (DRAPES) ×3 IMPLANT
DRAPE U-SHAPE 47X51 STRL (DRAPES) ×9 IMPLANT
DRSG ADAPTIC 3X8 NADH LF (GAUZE/BANDAGES/DRESSINGS) ×3 IMPLANT
DRSG MEPILEX BORDER 4X4 (GAUZE/BANDAGES/DRESSINGS) ×3 IMPLANT
DRSG MEPILEX BORDER 4X8 (GAUZE/BANDAGES/DRESSINGS) ×3 IMPLANT
DURAPREP 26ML APPLICATOR (WOUND CARE) ×3 IMPLANT
ELECT REM PT RETURN 9FT ADLT (ELECTROSURGICAL) ×3
ELECTRODE REM PT RTRN 9FT ADLT (ELECTROSURGICAL) ×1 IMPLANT
EVACUATOR 1/8 PVC DRAIN (DRAIN) ×3 IMPLANT
FACESHIELD WRAPAROUND (MASK) ×12 IMPLANT
FACESHIELD WRAPAROUND OR TEAM (MASK) ×4 IMPLANT
GLOVE BIO SURGEON STRL SZ7.5 (GLOVE) ×3 IMPLANT
GLOVE BIO SURGEON STRL SZ8 (GLOVE) ×6 IMPLANT
GLOVE BIOGEL PI IND STRL 8 (GLOVE) ×2 IMPLANT
GLOVE BIOGEL PI INDICATOR 8 (GLOVE) ×4
GOWN STRL REUS W/TWL LRG LVL3 (GOWN DISPOSABLE) ×3 IMPLANT
GOWN STRL REUS W/TWL XL LVL3 (GOWN DISPOSABLE) ×3 IMPLANT
KIT BASIN OR (CUSTOM PROCEDURE TRAY) ×3 IMPLANT
NDL SAFETY ECLIPSE 18X1.5 (NEEDLE) ×1 IMPLANT
NEEDLE HYPO 18GX1.5 SHARP (NEEDLE) ×3
PACK TOTAL JOINT (CUSTOM PROCEDURE TRAY) ×3 IMPLANT
PEN SKIN MARKING BROAD (MISCELLANEOUS) ×3 IMPLANT
STRIP CLOSURE SKIN 1/2X4 (GAUZE/BANDAGES/DRESSINGS) ×2 IMPLANT
SUT ETHIBOND NAB CT1 #1 30IN (SUTURE) ×3 IMPLANT
SUT MNCRL AB 4-0 PS2 18 (SUTURE) ×3 IMPLANT
SUT VIC AB 2-0 CT1 27 (SUTURE) ×6
SUT VIC AB 2-0 CT1 TAPERPNT 27 (SUTURE) ×2 IMPLANT
SUT VLOC 180 0 24IN GS25 (SUTURE) ×3 IMPLANT
SYR 30ML LL (SYRINGE) ×3 IMPLANT
SYR 50ML LL SCALE MARK (SYRINGE) IMPLANT
TOWEL OR 17X26 10 PK STRL BLUE (TOWEL DISPOSABLE) ×3 IMPLANT
TOWEL OR NON WOVEN STRL DISP B (DISPOSABLE) IMPLANT
TRAY FOLEY W/METER SILVER 16FR (SET/KITS/TRAYS/PACK) ×2 IMPLANT

## 2014-05-26 NOTE — Transfer of Care (Signed)
Immediate Anesthesia Transfer of Care Note  Patient: Alan Butler  Procedure(s) Performed: Procedure(s): RIGHT TOTAL HIP ARTHROPLASTY ANTERIOR APPROACH (Right)  Patient Location: PACU  Anesthesia Type:General  Level of Consciousness: awake, alert , oriented and patient cooperative  Airway & Oxygen Therapy: Patient Spontanous Breathing and Patient connected to face mask oxygen  Post-op Assessment: Report given to RN, Post -op Vital signs reviewed and stable and Patient moving all extremities  Post vital signs: Reviewed and stable  Last Vitals:  Filed Vitals:   05/26/14 0523  BP: 135/74  Pulse: 83  Temp: 36.4 C  Resp: 18    Complications: No apparent anesthesia complications

## 2014-05-26 NOTE — H&P (View-Only) (Signed)
Alan Butler. Lapid DOB: 31-Jan-1955 Married / Language: English / Race: White Male Date of Admission:  05/26/2014 CC:  Right Hip Pain History of Present Illness The patient is a 59 year old male who comes in  for a preoperative History and Physical. The patient is scheduled for a right total hip arthroplasty (anterior) to be performed by Dr. Gus Rankin. Aluisio, MD at [facility] on 05-26-2014. The patient is a 60 year old male who presents with a hip problem. The patient was seen for a second opinion.The patient reports right hip problems including pain and osteoarthritis symptoms that have been present for year(s). The symptoms began without any known injury. Prior to being seen today the patient was previously evaluated by a colleague (by Dr. Thurston Hole) 10 year(s) ago. Symptoms present at the patient's previous evaluation included pain in the hip. Previous workup for this problem has included pelvic x-rays, hip x-rays and hip MRI. He is having pain and decreased ROM. When he was seen by Dr. Thurston Hole 10 years ago he was told that he had some OA in hie right hip but that he was too young for THA. He was placed on meloxicam which his PCP quickly took him off of. He takes Aleve currently with minimal benefit. The pain has gotten progressively worse over the years. He has pain at all times but worse with weightbearing. His pain is in the groin and lateral right thigh, and radiates to the knee. He works in Production designer, theatre/television/film and is finding it difficult to do his work duties as a result of the hip. He is ready to have the hip replaced. Unfortunately, the right hip has gotten to the point where it is hurting him at all times. It is limiting what he can and cannot do. He has been told many years ago that he would eventually need to have the hip replaced but now he's at a stage where he cannot tolerate the pain anymore. It is affecting him day and night. It's affecting his ability to work. He is ready to proceed with  surgery. They have been treated conservatively in the past for the above stated problem and despite conservative measures, they continue to have progressive pain and severe functional limitations and dysfunction. They have failed non-operative management including home exercise, medications, and injections. It is felt that they would benefit from undergoing total joint replacement. Risks and benefits of the procedure have been discussed with the patient and they elect to proceed with surgery. There are no active contraindications to surgery such as ongoing infection or rapidly progressive neurological disease.  Problem List/Past Medical Primary osteoarthritis of right hip (M16.11) Hypercholesterolemia Skin Cancer Diabetes Mellitus, Type II Gastroesophageal Reflux Disease High blood pressure Allergic Urticaria Asthma Pneumonia Past History Hemorrhoids Kidney Stone Cellulitis Past History Staphlococcus Infections around 59 years old  Allergies No Known Drug Allergies  Family History  Congestive Heart Failure mother Diabetes Mellitus mother, grandmother mothers side and grandfather mothers side Heart Disease mother, grandmother mothers side and grandfather mothers side Cerebrovascular Accident grandfather fathers side Hypertension mother  Social History Children 1 Current work status working full time Drug/Alcohol Rehab (Currently) no Alcohol use current drinker; only occasionally per week Drug/Alcohol Rehab (Previously) no Pain Contract no Tobacco use never smoker Illicit drug use no Living situation live with spouse Marital status married 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 MetFORMIN HCl (  Tablet, Oral two times daily) Active. Fosinopril Sodium (  Tablet, Oral daily)  Active. Pantoprazole Sodium (40MG  Tablet DR, Oral two times daily) Active. Simvastatin (40MG  Tablet, Oral  at bedtime) Active. Naproxen Sodium (220MG  Tablet, Oral as needed) Active. Aspirin EC (81MG  Tablet DR, Oral daily) Active. ZyrTEC Allergy (10MG  Tablet, Oral daily) Active. (generic brand from [employer]) Clobetasol Propionate (0.05% Cream, External as needed) Active.  Past Surgical History  Arthroscopy of Shoulder right Tonsillectomy  Review of Systems General Not Present- Chills, Fatigue, Fever, Memory Loss, Night Sweats, Weight Gain and Weight Loss. Skin Not Present- Eczema, Hives, Itching, Lesions and Rash. HEENT Not Present- Dentures, Double Vision, Headache, Hearing Loss, Tinnitus and Visual Loss. Respiratory Not Present- Allergies, Chronic Cough, Coughing up blood, Shortness of breath at rest and Shortness of breath with exertion. Cardiovascular Not Present- Chest Pain, Difficulty Breathing Lying Down, Murmur, Palpitations, Racing/skipping heartbeats and Swelling. Gastrointestinal Present- Diarrhea (attributed to the metformin). Not Present- Abdominal Pain, Bloody Stool, Constipation, Difficulty Swallowing, Heartburn, Jaundice, Loss of appetitie, Nausea and Vomiting. Male Genitourinary Not Present- Blood in Urine, Discharge, Flank Pain, Incontinence, Painful Urination, Urgency, Urinary frequency, Urinary Retention, Urinating at Night and Weak urinary stream. Musculoskeletal Not Present- Back Pain, Joint Pain, Joint Swelling, Morning Stiffness, Muscle Pain, Muscle Weakness and Spasms. Neurological Not Present- Blackout spells, Difficulty with balance, Dizziness, Paralysis, Tremor and Weakness. Psychiatric Not Present- Insomnia.  Vitals Weight: 189 lb Height: 70in Weight was reported by patient. Height was reported by patient. Body Surface Area: 2.04 m Body Mass Index: 27.12 kg/m  BP: 122/78 (Sitting, Right Arm, Standard)  Physical Exam  General Mental Status -Alert, cooperative and good historian. General Appearance-pleasant, Not in acute  distress. Orientation-Oriented X3. Build & Nutrition-Well nourished and Well developed.  Head and Neck Head-normocephalic, atraumatic . Neck Global Assessment - supple, no bruit auscultated on the right, no bruit auscultated on the left.  Eye Vision-Wears corrective lenses. Pupil - Bilateral-Regular and Round. Motion - Bilateral-EOMI.  Chest and Lung Exam Auscultation Breath sounds - clear at anterior chest wall and clear at posterior chest wall. Adventitious sounds - No Adventitious sounds.  Cardiovascular Auscultation Rhythm - Regular rate and rhythm. Heart Sounds - S1 WNL and S2 WNL. Murmurs & Other Heart Sounds - Auscultation of the heart reveals - No Murmurs.  Abdomen Palpation/Percussion Tenderness - Abdomen is non-tender to palpation. Rigidity (guarding) - Abdomen is soft. Auscultation Auscultation of the abdomen reveals - Bowel sounds normal.  Male Genitourinary Note: Not done, not pertinent to present illness   Musculoskeletal Note: On exam, well-developed male, A&O, in no apparent distress. His left hip has normal motion without discomfort. Right hip has flexion to 80, no internal rotation, minimal external rotation, only about 15 degrees of abduction. There is pain on all ranges of motion. His knee shows no effusion, range about 0-125 with no tenderness or instability. Pulses, sensation and motor are intact distally. He has a significantly antalgic gait pattern on the right.  RADIOGRAPHS: AP pelvis and lateral of the right hip show that he has advanced end stage arthritis, bone on bone throughout with subchondral cystic formation and large osteophytes. Left hip has slight degenerative change.   Assessment & Plan Primary osteoarthritis of right hip (M16.11) Note:Surgical Plans: Right Total Hip Replacement - Anterior Approach  Disposition: HOME  PCP: Tomi BambergerSusan Fuller, NP - Patient has been seen preoperatively and felt to be stable for  surgery.  IV TXA  Anesthesia Issues: Difficult to wake up following the shoulder surgery.  Signed electronically by Lauraine RinneAlexzandrew L Mehlani Blankenburg, III PA-C

## 2014-05-26 NOTE — Plan of Care (Signed)
Problem: Consults Goal: Diagnosis- Total Joint Replacement Outcome: Completed/Met Date Met:  05/26/14 Primary Total Hip, Right Anterior

## 2014-05-26 NOTE — Anesthesia Postprocedure Evaluation (Signed)
  Anesthesia Post-op Note  Patient: Alan Butler  Procedure(s) Performed: Procedure(s) (LRB): RIGHT TOTAL HIP ARTHROPLASTY ANTERIOR APPROACH (Right)  Patient Location: PACU  Anesthesia Type: General  Level of Consciousness: awake and alert   Airway and Oxygen Therapy: Patient Spontanous Breathing  Post-op Pain: mild  Post-op Assessment: Post-op Vital signs reviewed, Patient's Cardiovascular Status Stable, Respiratory Function Stable, Patent Airway and No signs of Nausea or vomiting  Last Vitals:  Filed Vitals:   05/26/14 1030  BP: 136/72  Pulse: 75  Temp: 36.7 C  Resp: 15    Post-op Vital Signs: stable   Complications: No apparent anesthesia complications

## 2014-05-26 NOTE — Op Note (Signed)
OPERATIVE REPORT  PREOPERATIVE DIAGNOSIS: Osteoarthritis of the Right hip.   POSTOPERATIVE DIAGNOSIS: Osteoarthritis of the Right  hip.   PROCEDURE: Right total hip arthroplasty, anterior approach.   SURGEON: Ollen GrossFrank Freeland Pracht, MD   ASSISTANT: Avel Peacerew Perkins, PA-C  ANESTHESIA:  Spinal  ESTIMATED BLOOD LOSS:- 650 ml  DRAINS: Hemovac x1.   COMPLICATIONS: None   CONDITION: PACU - hemodynamically stable.   BRIEF CLINICAL NOTE: Alan Butler is a 59 y.o. male who has advanced end-  stage arthritis of his Right  hip with progressively worsening pain and  dysfunction.The patient has failed nonoperative management and presents for  total hip arthroplasty.   PROCEDURE IN DETAIL: After successful administration of spinal  anesthetic, the traction boots [facility] were placed on both  feet and the [facility], boots placed into the leg  holders. The Right hip was then isolated from the perineum with plastic  drapes and prepped and draped in the usual sterile fashion. ASIS and  greater trochanter were marked and a oblique incision was made, starting  at about 1 cm lateral and 2 cm distal to the ASIS and coursing towards  the anterior cortex of the femur. The skin was cut with a 10 blade  through subcutaneous tissue to the level of the fascia overlying the  tensor fascia lata muscle. The fascia was then incised in line with the  incision at the junction of the anterior third and posterior 2/3rd. The  muscle was teased off the fascia and then the interval between the TFL  and the rectus was developed. The Hohmann retractor was then placed at  the top of the femoral neck over the capsule. The vessels overlying the  capsule were cauterized and the fat on top of the capsule was removed.  A Hohmann retractor was then placed anterior underneath the rectus  femoris to give exposure to the entire anterior capsule. A T-shaped  capsulotomy was performed.  The edges were tagged and the femoral head  was identified.       Osteophytes are removed off the superior acetabulum.  The femoral neck was then cut in situ with an oscillating saw. Traction  was then applied to the left lower extremity utilizing the [facility]  traction. The femoral head was then removed. Retractors were placed  around the acetabulum and then circumferential removal of the labrum was  performed. Osteophytes were also removed. Reaming starts at 47 mm to  medialize and  Increased in 2 mm increments to 51 mm. We reamed in  approximately 40 degrees of abduction, 20 degrees anteversion. A 52 mm  pinnacle acetabular shell was then impacted in anatomic position under  fluoroscopic guidance with excellent purchase. We did not need to place  any additional dome screws. A 32 mm neutral + 4 marathon liner was then  placed into the acetabular shell.       The femoral lift was then placed along the lateral aspect of the femur  just distal to the vastus ridge. The leg was  externally rotated and capsule  was stripped off the inferior aspect of the femoral neck down to the  level of the lesser trochanter, this was done with electrocautery. The femur was lifted after this was performed. The  leg was then placed and extended in adducted position to essentially delivering the femur. We also removed the capsule superiorly and the  piriformis from the piriformis  fossa to gain excellent exposure of the  proximal femur. Rongeur was used to remove some cancellous bone to get  into the lateral portion of the proximal femur for placement of the  initial starter reamer. The starter broaches was placed  the starter broach  and was shown to go down the center of the canal. Broaching  with the  Corail system was then performed starting at size 8, coursing  Up to size 13. A size 13 had excellent torsional and rotational  and axial stability. The trial high offset neck was then placed  with a 32 + 8.5 trial  head. The hip was then reduced. We confirmed that  the stem was in the canal both on AP and lateral x-rays. It also has excellent sizing. The hip was reduced with outstanding stability through full extension, full external rotation,  and then flexion in adduction internal rotation. AP pelvis was taken  and the leg lengths were measured and found to be exactly equal. Hip  was then dislocated again and the femoral head and neck removed. The  femoral broach was removed. Size 13 Corail stem with a standard offset  neck was then impacted into the femur following native anteversion. Has  excellent purchase in the canal. Excellent torsional and rotational and  axial stability. It is confirmed to be in the canal on AP and lateral  fluoroscopic views. The 32 + 8.5 ceramic head was placed and the hip  reduced with outstanding stability. Again AP pelvis was taken and it  confirmed that the leg lengths were equal. The wound was then copiously  irrigated with saline solution and the capsule reattached and repaired  with Ethibond suture.30 ml of .25% Bupivicaine injected into the capsule and into the edge of the tensor fascia lata as well as subcutaneous tissue. The fascia overlying the tensor fascia lata was  then closed with a running #1 V-Loc. Subcu was closed with interrupted  2-0 Vicryl and subcuticular running 4-0 Monocryl. Incision was cleaned  and dried. Steri-Strips and a bulky sterile dressing applied. Hemovac  drain was hooked to suction and then he was awakened and transported to  recovery in stable condition.        Please note that a surgical assistant was a medical necessity for this procedure to perform it in a safe and expeditious manner. Assistant was necessary to provide appropriate retraction of vital neurovascular structures and to prevent femoral fracture and allow for anatomic placement of the prosthesis.  Ollen Gross, M.D.

## 2014-05-26 NOTE — Anesthesia Procedure Notes (Signed)
Procedure Name: Intubation Date/Time: 05/26/2014 7:25 AM Performed by: Jarvis NewcomerARMISTEAD, Blessen Kimbrough A Pre-anesthesia Checklist: Patient identified, Timeout performed, Emergency Drugs available, Suction available and Patient being monitored Patient Re-evaluated:Patient Re-evaluated prior to inductionOxygen Delivery Method: Circle system utilized Preoxygenation: Pre-oxygenation with 100% oxygen Intubation Type: IV induction Ventilation: Mask ventilation without difficulty Laryngoscope Size: Mac and 4 Grade View: Grade I Tube type: Oral Tube size: 7.5 mm Number of attempts: 1 Airway Equipment and Method: Stylet Placement Confirmation: breath sounds checked- equal and bilateral,  ETT inserted through vocal cords under direct vision and positive ETCO2 Secured at: 22 cm Tube secured with: Tape Dental Injury: Teeth and Oropharynx as per pre-operative assessment

## 2014-05-26 NOTE — Interval H&P Note (Signed)
History and Physical Interval Note:  05/26/2014 7:12 AM  Alan Butler  has presented today for surgery, with the diagnosis of OA RIGHT HIP  The various methods of treatment have been discussed with the patient and family. After consideration of risks, benefits and other options for treatment, the patient has consented to  Procedure(s): RIGHT TOTAL HIP ARTHROPLASTY ANTERIOR APPROACH (Right) as a surgical intervention .  The patient's history has been reviewed, patient examined, no change in status, stable for surgery.  I have reviewed the patient's chart and labs.  Questions were answered to the patient's satisfaction.     Loanne DrillingALUISIO,Leanna Hamid V

## 2014-05-27 LAB — BASIC METABOLIC PANEL
ANION GAP: 5 (ref 5–15)
BUN: 11 mg/dL (ref 6–23)
CALCIUM: 8.7 mg/dL (ref 8.4–10.5)
CHLORIDE: 105 mmol/L (ref 96–112)
CO2: 27 mmol/L (ref 19–32)
CREATININE: 0.62 mg/dL (ref 0.50–1.35)
GFR calc non Af Amer: >90 mL/min (ref >90)
Glucose, Bld: 152 mg/dL — ABNORMAL HIGH (ref 70–99)
Potassium: 4.2 mmol/L (ref 3.5–5.1)
SODIUM: 137 mmol/L (ref 135–145)

## 2014-05-27 LAB — CBC
HEMATOCRIT: 34.6 % — AB (ref 39.0–52.0)
HEMOGLOBIN: 11.1 g/dL — AB (ref 13.0–17.0)
MCH: 30.9 pg (ref 26.0–34.0)
MCHC: 32.1 g/dL (ref 30.0–36.0)
MCV: 96.4 fL (ref 78.0–100.0)
PLATELETS: 145 10*3/uL — AB (ref 150–400)
RBC: 3.59 MIL/uL — AB (ref 4.22–5.81)
RDW: 12.8 % (ref 11.5–15.5)
WBC: 12.9 10*3/uL — ABNORMAL HIGH (ref 4.0–10.5)

## 2014-05-27 LAB — GLUCOSE, CAPILLARY
GLUCOSE-CAPILLARY: 147 mg/dL — AB (ref 70–99)
GLUCOSE-CAPILLARY: 172 mg/dL — AB (ref 70–99)
GLUCOSE-CAPILLARY: 153 mg/dL — AB (ref 70–99)
Glucose-Capillary: 117 mg/dL — ABNORMAL HIGH (ref 70–99)

## 2014-05-27 NOTE — Progress Notes (Signed)
   05/27/14 1400  PT Visit Information  Last PT Received On 05/27/14  Assistance Needed +1  History of Present Illness s/p R DA THA  PT Time Calculation  PT Start Time (ACUTE ONLY) 1403  PT Stop Time (ACUTE ONLY) 1429  PT Time Calculation (min) (ACUTE ONLY) 26 min  Subjective Data  Patient Stated Goal to get back to work  Precautions  Precautions Fall  Restrictions  Other Position/Activity Restrictions WBAT  Pain Assessment  Pain Assessment 0-10  Pain Score 4  Pain Location R hip  Pain Descriptors / Indicators Sore  Pain Intervention(s) Limited activity within patient's tolerance;Monitored during session;Ice applied  Cognition  Arousal/Alertness Awake/alert  Behavior During Therapy WFL for tasks assessed/performed  Overall Cognitive Status Within Functional Limits for tasks assessed  Bed Mobility  Overal bed mobility (pt wanted to stay in chair)  Transfers  Overall transfer level Needs assistance  Equipment used Rolling walker (2 wheeled)  Transfers Sit to/from Stand  Sit to Stand Min guard  General transfer comment cues for hand palcement and R LE management  Ambulation/Gait  Ambulation/Gait assistance Min guard  Ambulation Distance (Feet) 60 Feet (15')  Assistive device Rolling walker (2 wheeled)  Gait Pattern/deviations Step-to pattern;Trunk flexed;Decreased weight shift to right;Antalgic  General Gait Details cues for posture, upward gaze, step length   Total Joint Exercises  Ankle Circles/Pumps AROM;Both;10 reps  Quad Sets 10 reps;Both;AROM  Heel Slides AAROM;Right;10 reps  Hip ABduction/ADduction AAROM;Right;10 reps  PT - End of Session  Equipment Utilized During Treatment Gait belt  Activity Tolerance Patient tolerated treatment well  Patient left in chair;with call bell/phone within reach  Nurse Communication Mobility status  PT - Assessment/Plan  PT Plan Current plan remains appropriate  PT Frequency (ACUTE ONLY) 7X/week  Follow Up Recommendations Home  health PT  PT equipment Rolling walker with 5" wheels  PT Goal Progression  Progress towards PT goals Progressing toward goals  Acute Rehab PT Goals  PT Goal Formulation With patient  Time For Goal Achievement 05/29/14  Potential to Achieve Goals Good  PT General Charges  $$ ACUTE PT VISIT 1 Procedure  PT Treatments  $Gait Training 23-37 mins

## 2014-05-27 NOTE — Progress Notes (Signed)
   Subjective: 1 Day Post-Op Procedure(s) (LRB): RIGHT TOTAL HIP ARTHROPLASTY ANTERIOR APPROACH (Right) Patient reports pain as moderate.   We will start therapy today.  Plan is to go Home after hospital stay.  Objective: Vital signs in last 24 hours: Temp:  [97.4 F (36.3 C)-98.8 F (37.1 C)] 98.7 F (37.1 C) (04/28 0614) Pulse Rate:  [64-99] 64 (04/28 0614) Resp:  [10-16] 16 (04/28 0614) BP: (110-165)/(61-87) 129/86 mmHg (04/28 0614) SpO2:  [98 %-100 %] 100 % (04/28 0614)  Intake/Output from previous day:  Intake/Output Summary (Last 24 hours) at 05/27/14 0727 Last data filed at 05/27/14 0601  Gross per 24 hour  Intake 5610.33 ml  Output   6325 ml  Net -714.67 ml    Intake/Output this shift:    Labs:  Recent Labs  05/27/14 0408  HGB 11.1*    Recent Labs  05/27/14 0408  WBC 12.9*  RBC 3.59*  HCT 34.6*  PLT 145*    Recent Labs  05/27/14 0408  NA 137  K 4.2  CL 105  CO2 27  BUN 11  CREATININE 0.62  GLUCOSE 152*  CALCIUM 8.7   No results for input(s): LABPT, INR in the last 72 hours.  EXAM General - Patient is Alert, Appropriate and Oriented Extremity - Neurologically intact Neurovascular intact No cellulitis present Compartment soft Dressing - dressing C/D/I Motor Function - intact, moving foot and toes well on exam.  Hemovac pulled without difficulty.  Past Medical History  Diagnosis Date  . Complication of anesthesia     2001- slow to wake up   . Family history of adverse reaction to anesthesia     father had reaction - became rigid after surgery - patient states they no longer use this anesthesia   . Hypertension   . Diabetes mellitus without complication   . Asthma     as a child   . Pneumonia     hx of pneumonia x 2   . History of kidney stones   . GERD (gastroesophageal reflux disease)   . Arthritis   . Headache     occasional   . Cancer     hx of skin cancer     Assessment/Plan: 1 Day Post-Op Procedure(s)  (LRB): RIGHT TOTAL HIP ARTHROPLASTY ANTERIOR APPROACH (Right) Principal Problem:   OA (osteoarthritis) of hip   Advance diet Up with therapy D/C IV fluids Plan for discharge tomorrow  DVT Prophylaxis - Xarelto Weight Bearing As Tolerated right Leg   Sharaine Delange V

## 2014-05-27 NOTE — Care Management Note (Signed)
    Page 1 of 2   05/27/2014     12:46:29 PM CARE MANAGEMENT NOTE 05/27/2014  Patient:  JADEN, ABREU   Account Number:  [id number]  Date Initiated:  05/27/2014  Documentation initiated by:  [facility]  Subjective/Objective Assessment:   YXA:[id number] TOTAL HIP ARTHROPLASTY ANTERIOR APPROACH (Right)     Action/Plan:   discharge planning   Anticipated DC Date:  05/28/2014   Anticipated DC Plan:  [place]  CM consult      [facility] Choice  HOME HEALTH   Choice offered to / List presented to:  C-1 Patient   DME arranged  Vassie Moselle      DME agency  [place] arranged  [place]   Status of service:  Completed, signed off Medicare Important Message given?   (If response is "NO", the following Medicare IM given date fields will be blank) Date Medicare IM given:   Medicare IM given by:   Date Additional Medicare IM given:   Additional Medicare IM given by:    Discharge Disposition:  [place]  Per UR Regulation:  Reviewed for med. necessity/level of care/duration of stay  If discussed at [place] of Stay Meetings, dates discussed:    Comments:  05/27/14 11:00 Cm met with pt in room to offer choice of home health agency.  Pt chooses [facility] to render HHPT.  CM called [facility] DME rep to please deliver the rolling walker to room prior to discharge.  Physical address [place] [id number] was emailed to [employer], Tim and referral for HHPT.  No other Cm needs were communicated.  Tempie Hoist, BSN, [place].

## 2014-05-27 NOTE — Evaluation (Signed)
Occupational Therapy Evaluation Patient Details Name: Alan Butler MRN: [id number] DOB: 08-20-1955 Today's Date: 05/27/2014    History of Present Illness s/p R DA THA   Clinical Impression   This 59 yo male admitted with above presents to acute OT with increased pain, decreased mobility, decreased balance, and decreased ROM of RLE all affecting his ability to care for himself at an independent level as he was pta. He will benefit from acute OT without need for follow up.    Follow Up Recommendations  No OT follow up    Equipment Recommendations  None recommended by OT       Precautions / Restrictions Precautions Precautions: Fall Restrictions Weight Bearing Restrictions: No Other Position/Activity Restrictions: WBAT      Mobility Bed Mobility Overal bed mobility: Needs Assistance Bed Mobility: Supine to Sit     Supine to sit: Mod assist;HOB elevated (use of rail)     General bed mobility comments: OOB with OT  Transfers Overall transfer level: Needs assistance Equipment used: Rolling walker (2 wheeled) Transfers: Sit to/from Stand Sit to Stand: Min assist         General transfer comment: cues for hand palcement and R LE management         ADL Overall ADL's : Needs assistance/impaired Eating/Feeding: Independent;Sitting   Grooming: Set up;Sitting   Upper Body Bathing: Set up;Sitting   Lower Body Bathing: Moderate assistance (with min A sit<>stand)   Upper Body Dressing : Set up;Sitting   Lower Body Dressing: Maximal assistance (with min A sit<>stand)   Toilet Transfer: Minimal assistance;Ambulation (bed (raised) to recliner 3 feet away)   Toileting- Clothing Manipulation and Hygiene: Minimal assistance;Sit to/from stand               Vision Additional Comments: No change from baseline          Pertinent Vitals/Pain Pain Assessment: 0-10 Pain Score: 7  Pain Location: R hip Pain Descriptors / Indicators: Sore;Constant Pain  Intervention(s): Limited activity within patient's tolerance;Monitored during session     Hand Dominance Right   Extremity/Trunk Assessment Upper Extremity Assessment Upper Extremity Assessment: Overall WFL for tasks assessed   Lower Extremity Assessment Lower Extremity Assessment: RLE deficits/detail RLE Deficits / Details: hip and knee grossly 2+/5, limited by pain; ankle [facility]       Communication Communication Communication: No difficulties   Cognition Arousal/Alertness: Awake/alert Behavior During Therapy: WFL for tasks assessed/performed Overall Cognitive Status: Within Functional Limits for tasks assessed                                Home Living Family/patient expects to be discharged to:: Private residence Living Arrangements: Spouse/significant other Available Help at Discharge: Family;Available 24 hours/day Type of Home: House Home Access: Ramped entrance     Home Layout: Two level;Able to live on main level with bedroom/bathroom     Bathroom Shower/Tub: Producer, television/film/videoWalk-in shower   Bathroom Toilet: Handicapped height (bar on one side and sink counter on the other)     Home Equipment: Cane - single point;Bedside commode   Additional Comments: pt is a Data processing managermaintenance mechanic; 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"      Prior Functioning/Environment Level of Independence: Independent with assistive device(s)             OT Diagnosis: Generalized weakness;Acute pain   OT Problem List: Decreased strength;Decreased range of motion;Decreased activity tolerance;Impaired balance (sitting and/or standing);Pain;Decreased knowledge of use of DME  or AE   OT Treatment/Interventions: Self-care/ADL training;Patient/family education;DME and/or AE instruction;Therapeutic activities;Balance training    OT Goals(Current goals can be found in the care plan section) Acute Rehab OT Goals Patient Stated Goal: to get back to work OT Goal Formulation: With patient Time For Goal Achievement:  06/03/14 Potential to Achieve Goals: Good  OT Frequency: Min 2X/week              End of Session Equipment Utilized During Treatment: Rolling walker Nurse Communication:  (Pt is nauseated)  Activity Tolerance: Patient limited by pain (and nausea) Patient left: in chair;with call bell/phone within reach   Time: 1610-9604 OT Time Calculation (min): 32 min Charges:  OT General Charges $OT Visit: 1 Procedure OT Evaluation $Initial OT Evaluation Tier I: 1 Procedure OT Treatments $Self Care/Home Management : 8-22 mins  Evette Georges [phone] 05/27/2014, 12:13 PM

## 2014-05-27 NOTE — Evaluation (Signed)
Physical Therapy Evaluation Patient Details Name: Alan Butler MRN: [id number] DOB: 01-05-1956 Today's Date: 05/27/2014   History of Present Illness  s/p R DA THA  Clinical Impression  Pt will benefit from PT to address deficits below; Plan is for home with HHPT, will need RW for home    Follow Up Recommendations Home health PT    Equipment Recommendations  Rolling walker with 5" wheels    Recommendations for Other Services       Precautions / Restrictions Precautions Precautions: Fall Restrictions Weight Bearing Restrictions: No Other Position/Activity Restrictions: WBAT      Mobility  Bed Mobility               General bed mobility comments: OOB with OT  Transfers Overall transfer level: Needs assistance Equipment used: Rolling walker (2 wheeled) Transfers: Sit to/from Stand Sit to Stand: Min assist         General transfer comment: cues for hand palcement and R LE management  Ambulation/Gait Ambulation/Gait assistance: Min guard;Supervision Ambulation Distance (Feet): 45 Feet Assistive device: Rolling walker (2 wheeled) Gait Pattern/deviations: Step-to pattern;Antalgic;Trunk flexed;Decreased weight shift to right   Gait velocity interpretation: Below normal speed for age/gender General Gait Details: cues for posture, upward gaze, step length and RW postion from self;  Stairs            Wheelchair Mobility    Modified Rankin (Stroke Patients Only)       Balance                                             Pertinent Vitals/Pain Pain Assessment: 0-10 Pain Location: R hip Pain Descriptors / Indicators: Sore;Constant Pain Intervention(s): Limited activity within patient's tolerance;Monitored during session    Home Living Family/patient expects to be discharged to:: Private residence Living Arrangements: Spouse/significant other   Type of Home: House Home Access: Ramped entrance     Home Layout: Two  level;Able to live on main level with bedroom/bathroom Home Equipment: Gilmer MorCane - single point;Bedside commode Additional Comments: pt is a Data processing managermaintenance mechanic; 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"    Prior Function Level of Independence: Independent with assistive device(s)               Hand Dominance        Extremity/Trunk Assessment   Upper Extremity Assessment: Defer to OT evaluation           Lower Extremity Assessment: RLE deficits/detail RLE Deficits / Details: hip and knee grossly 2+/5, limited by pain; ankle WFL       Communication   Communication: No difficulties  Cognition Arousal/Alertness: Awake/alert Behavior During Therapy: WFL for tasks assessed/performed Overall Cognitive Status: Within Functional Limits for tasks assessed                      General Comments      Exercises Total Joint Exercises Ankle Circles/Pumps: AROM;Both;10 reps Quad Sets: 10 reps;Both;AROM      Assessment/Plan    PT Assessment Patient needs continued PT services  PT Diagnosis Difficulty walking   PT Problem List Decreased strength;Decreased range of motion;Decreased activity tolerance;Decreased mobility;Decreased knowledge of use of DME;Pain  PT Treatment Interventions DME instruction;Gait training;Functional mobility training;Therapeutic activities;Patient/family education;Therapeutic exercise   PT Goals (Current goals can be found in the Care Plan section) Acute Rehab PT Goals Patient Stated Goal: get back to  work, walk without cane PT Goal Formulation: With patient Time For Goal Achievement: 05/29/14 Potential to Achieve Goals: Good    Frequency 7X/week   Barriers to discharge        Co-evaluation               End of Session Equipment Utilized During Treatment: Gait belt Activity Tolerance: Patient tolerated treatment well Patient left: in chair;with call bell/phone within reach;with nursing/sitter in room Nurse Communication: Mobility status          Time: 4098-1191 PT Time Calculation (min) (ACUTE ONLY): 26 min   Charges:   PT Evaluation $Initial PT Evaluation Tier I: 1 Procedure PT Treatments $Gait Training: 8-22 mins   PT G Codes:        Alan Butler 06/01/14, 9:32 AM

## 2014-05-28 LAB — BASIC METABOLIC PANEL
Anion gap: 5 (ref 5–15)
BUN: 13 mg/dL (ref 6–23)
CALCIUM: 9 mg/dL (ref 8.4–10.5)
CO2: 27 mmol/L (ref 19–32)
Chloride: 105 mmol/L (ref 96–112)
Creatinine, Ser: 0.61 mg/dL (ref 0.50–1.35)
GFR calc Af Amer: >90 mL/min (ref >90)
Glucose, Bld: 132 mg/dL — ABNORMAL HIGH (ref 70–99)
Potassium: 3.9 mmol/L (ref 3.5–5.1)
SODIUM: 137 mmol/L (ref 135–145)

## 2014-05-28 LAB — CBC
HEMATOCRIT: 33.5 % — AB (ref 39.0–52.0)
HEMOGLOBIN: 10.8 g/dL — AB (ref 13.0–17.0)
MCH: 30.9 pg (ref 26.0–34.0)
MCHC: 32.2 g/dL (ref 30.0–36.0)
MCV: 95.7 fL (ref 78.0–100.0)
PLATELETS: 148 10*3/uL — AB (ref 150–400)
RBC: 3.5 MIL/uL — AB (ref 4.22–5.81)
RDW: 12.9 % (ref 11.5–15.5)
WBC: 13 10*3/uL — ABNORMAL HIGH (ref 4.0–10.5)

## 2014-05-28 LAB — GLUCOSE, CAPILLARY: Glucose-Capillary: 135 mg/dL — ABNORMAL HIGH (ref 70–99)

## 2014-05-28 MED ORDER — OXYCODONE HCL 5 MG PO TABS: 5.0000 mg | ORAL_TABLET | ORAL | Status: DC | PRN

## 2014-05-28 MED ORDER — RIVAROXABAN 10 MG PO TABS: 10.0000 mg | ORAL_TABLET | Freq: Every day | ORAL | Status: DC

## 2014-05-28 MED ORDER — METHOCARBAMOL 500 MG PO TABS: 500.0000 mg | ORAL_TABLET | Freq: Four times a day (QID) | ORAL | Status: DC | PRN

## 2014-05-28 MED ORDER — TRAMADOL HCL 50 MG PO TABS: 50.0000 mg | ORAL_TABLET | Freq: Four times a day (QID) | ORAL | Status: DC | PRN

## 2014-05-28 NOTE — Discharge Instructions (Addendum)
° °Dr. Frank Aluisio °Total Joint Specialist °[facility] °[address] °[place], [place] [address] °[phone] ° °ANTERIOR APPROACH TOTAL HIP REPLACEMENT POSTOPERATIVE DIRECTIONS ° ° °Hip Rehabilitation, Guidelines Following Surgery  °The results of a hip operation are greatly improved after range of motion and muscle strengthening exercises. Follow all safety measures which are given to protect your hip. If any of these exercises cause increased pain or swelling in your joint, decrease the amount until you are comfortable again. Then slowly increase the exercises. Call your caregiver if you have problems or questions.  ° °HOME CARE INSTRUCTIONS  °Remove items at home which could result in a fall. This includes throw rugs or furniture in walking pathways.  °· ICE to the affected hip every three hours for 30 minutes at a time and then as needed for pain and swelling.  Continue to use ice on the hip for pain and swelling from surgery. You may notice swelling that will progress down to the foot and ankle.  This is normal after surgery.  Elevate the leg when you are not up walking on it.   °· Continue to use the breathing machine which will help keep your temperature down.  It is common for your temperature to cycle up and down following surgery, especially at night when you are not up moving around and exerting yourself.  The breathing machine keeps your lungs expanded and your temperature down. °· Do not place pillow under knee, focus on keeping the knee straight while resting ° °DIET °You may resume your previous home diet once your are discharged from the hospital. ° °DRESSING / WOUND CARE / SHOWERING °You may change your dressing 3-5 days after surgery.  Then change the dressing every day with sterile gauze.  Please use good hand washing techniques before changing the dressing.  Do not use any lotions or creams on the incision until instructed by your surgeon. °You may start showering  once you are discharged home but do not submerge the incision under water. Just pat the incision dry and apply a dry gauze dressing on daily. °Change the surgical dressing daily and reapply a dry dressing each time. ° °ACTIVITY °Walk with your walker as instructed. °Use walker as long as suggested by your caregivers. °Avoid periods of inactivity such as sitting longer than an hour when not asleep. This helps prevent blood clots.  °You may resume a sexual relationship in one month or when given the OK by your doctor.  °You may return to work once you are cleared by your doctor.  °Do not drive a car for 6 weeks or until released by you surgeon.  °Do not drive while taking narcotics. ° °WEIGHT BEARING °Weight bearing as tolerated with assist device (walker, cane, etc) as directed, use it as long as suggested by your surgeon or therapist, typically at least 4-6 weeks. ° °POSTOPERATIVE CONSTIPATION PROTOCOL °Constipation - defined medically as fewer than three stools per week and severe constipation as less than one stool per week. ° °One of the most common issues patients have following surgery is constipation.  Even if you have a regular bowel pattern at home, your normal regimen is likely to be disrupted due to multiple reasons following surgery.  Combination of anesthesia, postoperative narcotics, change in appetite and fluid intake all can affect your bowels.  In order to avoid complications following surgery, here are some recommendations in order to help you during your recovery period. ° °Colace (docusate) - Pick   up an over-the-counter form of Colace or another stool softener and take twice a day as long as you are requiring postoperative pain medications.  Take with a full glass of water daily.  If you experience loose stools or diarrhea, hold the colace until you stool forms back up.  If your symptoms do not get better within 1 week or if they get worse, check with your doctor. ° °Dulcolax (bisacodyl) - Pick up  over-the-counter and take as directed by the product packaging as needed to assist with the movement of your bowels.  Take with a full glass of water.  Use this product as needed if not relieved by Colace only.  ° °MiraLax (polyethylene glycol) - Pick up over-the-counter to have on hand.  MiraLax is a solution that will increase the amount of water in your bowels to assist with bowel movements.  Take as directed and can mix with a glass of water, juice, soda, coffee, or tea.  Take if you go more than two days without a movement. °Do not use MiraLax more than once per day. Call your doctor if you are still constipated or irregular after using this medication for 7 days in a row. ° °If you continue to have problems with postoperative constipation, please contact the office for further assistance and recommendations.  If you experience "the worst abdominal pain ever" or develop nausea or vomiting, please contact the office immediatly for further recommendations for treatment. ° °ITCHING ° If you experience itching with your medications, try taking only a single pain pill, or even half a pain pill at a time.  You can also use Benadryl over the counter for itching or also to help with sleep.  ° °TED HOSE STOCKINGS °Wear the elastic stockings on both legs for three weeks following surgery during the day but you may remove then at night for sleeping. ° °MEDICATIONS °See your medication summary on the “After Visit Summary” that the nursing staff will review with you prior to discharge.  You may have some home medications which will be placed on hold until you complete the course of blood thinner medication.  It is important for you to complete the blood thinner medication as prescribed by your surgeon.  Continue your approved medications as instructed at time of discharge. ° °PRECAUTIONS °If you experience chest pain or shortness of breath - call 911 immediately for transfer to the hospital emergency department.  °If you  develop a fever greater that 101 F, purulent drainage from wound, increased redness or drainage from wound, foul odor from the wound/dressing, or calf pain - CONTACT YOUR SURGEON.   °                                                °FOLLOW-UP APPOINTMENTS °Make sure you keep all of your appointments after your operation with your surgeon and caregivers. You should call the office at the above phone number and make an appointment for approximately two weeks after the date of your surgery or on the date instructed by your surgeon outlined in the "After Visit Summary". ° °RANGE OF MOTION AND STRENGTHENING EXERCISES  °These exercises are designed to help you keep full movement of your hip joint. Follow your caregiver's or physical therapist's instructions. Perform all exercises about fifteen times, three times per day or as directed. Exercise both hips,   even if you have had only one joint replacement. These exercises can be done on a training (exercise) mat, on the floor, on a table or on a bed. Use whatever works the best and is most comfortable for you. Use music or television while you are exercising so that the exercises are a pleasant break in your day. This will make your life better with the exercises acting as a break in routine you can look forward to.  °Lying on your back, slowly slide your foot toward your buttocks, raising your knee up off the floor. Then slowly slide your foot back down until your leg is straight again.  °Lying on your back spread your legs as far apart as you can without causing discomfort.  °Lying on your side, raise your upper leg and foot straight up from the floor as far as is comfortable. Slowly lower the leg and repeat.  °Lying on your back, tighten up the muscle in the front of your thigh (quadriceps muscles). You can do this by keeping your leg straight and trying to raise your heel off the floor. This helps strengthen the largest muscle supporting your knee.  °Lying on your back,  tighten up the muscles of your buttocks both with the legs straight and with the knee bent at a comfortable angle while keeping your heel on the floor.  ° °IF YOU ARE TRANSFERRED TO A SKILLED REHAB FACILITY °If the patient is transferred to a skilled rehab facility following release from the hospital, a list of the current medications will be sent to the facility for the patient to continue.  When discharged from the skilled rehab facility, please have the facility set up the patient's Home Health Physical Therapy prior to being released. Also, the skilled facility will be responsible for providing the patient with their medications at time of release from the facility to include their pain medication, the muscle relaxants, and their blood thinner medication. If the patient is still at the rehab facility at time of the two week follow up appointment, the skilled rehab facility will also need to assist the patient in arranging follow up appointment in our office and any transportation needs. ° °MAKE SURE YOU:  °Understand these instructions.  °Get help right away if you are not doing well or get worse.  ° ° °Pick up stool softner and laxative for home use following surgery while on pain medications. °Do not submerge incision under water. °Please use good hand washing techniques while changing dressing each day. °May shower starting three days after surgery. °Please use a clean towel to pat the incision dry following showers. °Continue to use ice for pain and swelling after surgery. °Do not use any lotions or creams on the incision until instructed by your surgeon. ° °Take Xarelto for two and a half more weeks, then discontinue Xarelto. °Once the patient has completed the Xarelto, they may resume the 81 mg Aspirin. ° ° °Information on my medicine - XARELTO® (Rivaroxaban) ° °This medication education was reviewed with me or my healthcare representative as part of my discharge preparation.  The pharmacist that spoke  with me during my hospital stay was:  Christine ° °Why was Xarelto® prescribed for you? °Xarelto® was prescribed for you to reduce the risk of blood clots forming after orthopedic surgery. The medical term for these abnormal blood clots is venous thromboembolism (VTE). ° °What do you need to know about xarelto® ? °Take your Xarelto® ONCE DAILY at the same time every   day. °You may take it either with or without food. ° °If you have difficulty swallowing the tablet whole, you may crush it and mix in applesauce just prior to taking your dose. ° °Take Xarelto® exactly as prescribed by your doctor and DO NOT stop taking Xarelto® without talking to the doctor who prescribed the medication.  Stopping without other VTE prevention medication to take the place of Xarelto® may increase your risk of developing a clot. ° °After discharge, you should have regular check-up appointments with your healthcare provider that is prescribing your Xarelto®.   ° °What do you do if you miss a dose? °If you miss a dose, take it as soon as you remember on the same day then continue your regularly scheduled once daily regimen the next day. Do not take two doses of Xarelto® on the same day.  ° °Important Safety Information °A possible side effect of Xarelto® is bleeding. You should call your healthcare provider right away if you experience any of the following: °? Bleeding from an injury or your nose that does not stop. °? Unusual colored urine (red or dark brown) or unusual colored stools (red or black). °? Unusual bruising for unknown reasons. °? A serious fall or if you hit your head (even if there is no bleeding). ° °Some medicines may interact with Xarelto® and might increase your risk of bleeding while on Xarelto®. To help avoid this, consult your healthcare provider or pharmacist prior to using any new prescription or non-prescription medications, including herbals, vitamins, non-steroidal anti-inflammatory drugs (NSAIDs) and  supplements. ° °This website has more information on Xarelto®: www.xarelto.com. ° ° ° °

## 2014-05-28 NOTE — Progress Notes (Signed)
RN reviewed discharge instructions with patient and family. All questions answered.  Paperwork and prescriptions given.   NT rolled patient down in wheelchair to family car.  

## 2014-05-28 NOTE — Progress Notes (Signed)
Occupational Therapy Treatment Patient Details Name: Alan Butler MRN: [id number] DOB: 1955-07-20 Today's Date: 05/28/2014    History of present illness s/p R DA THA   OT comments  Pt is making good progress in OT.  Min guard ambulating to bathroom for safety: pt felt stiff.  No LOB.    Follow Up Recommendations  No OT follow up    Equipment Recommendations  None recommended by OT    Recommendations for Other Services      Precautions / Restrictions Precautions Precautions: Fall Restrictions Weight Bearing Restrictions: No Other Position/Activity Restrictions: WBAT       Mobility Bed Mobility               General bed mobility comments: pt was OOB  Transfers   Equipment used: Rolling walker (2 wheeled) Transfers: Sit to/from Stand Sit to Stand: Min guard         General transfer comment: cues for RLE placement; extra time; transition for sit to stand not smooth    Balance                                   ADL                       Lower Body Dressing: Minimal assistance;Sit to/from stand;With adaptive equipment   Toilet Transfer: Min guard;Ambulation;Comfort height toilet (with grab bars)       Tub/ Shower Transfer: Walk-in shower;Min guard     General ADL Comments: Pt is familiar with AE from when his father is alive.  He doesn't have any but plans to have family member get kit downstairs.  Reviewed and practiced sock aide.   Shower seat is approximiately height of handicapped height toilet.  He does not have grab bars.  he has a 3:1 commode at home but doesn't want to use this in the shower as he is not sure how thick his floor is. (a familiy member had a seat punch through the bottom of their shower).  Encouraged him to have HHPT do a dry run with him to make sure that he can safely access seat (he used grab bar when sitting on commode, which is similar height)      Vision                     Perception      Praxis      Cognition   Behavior During Therapy: WFL for tasks assessed/performed Overall Cognitive Status: Within Functional Limits for tasks assessed                       Extremity/Trunk Assessment               Exercises     Shoulder Instructions       General Comments      Pertinent Vitals/ Pain       Pain Score: 4  Pain Location: R hip Pain Descriptors / Indicators: Sore Pain Intervention(s): Limited activity within patient's tolerance;Monitored during session;Premedicated before session;Repositioned;Ice applied  Home Living                                          Prior Functioning/Environment  Frequency       Progress Toward Goals  OT Goals(current goals can now be found in the care plan section)  Progress towards OT goals: Progressing toward goals     Plan Discharge plan remains appropriate    Co-evaluation                 End of Session     Activity Tolerance Patient tolerated treatment well   Patient Left in chair;with call bell/phone within reach   Nurse Communication          Time: [phone] OT Time Calculation (min): 44 min  Charges: OT General Charges $OT Visit: 1 Procedure OT Treatments $Self Care/Home Management : 23-37 mins  Cyan Clippinger 05/28/2014, 10:44 AM  Lesle Chris, OTR/L [phone] 05/28/2014

## 2014-05-28 NOTE — Progress Notes (Signed)
   Subjective: 2 Days Post-Op Procedure(s) (LRB): RIGHT TOTAL HIP ARTHROPLASTY ANTERIOR APPROACH (Right) Patient reports pain as mild.   Plan is to go Home after hospital stay.  Objective: Vital signs in last 24 hours: Temp:  [98.4 F (36.9 C)-99.2 F (37.3 C)] 99.2 F (37.3 C) (04/29 0601) Pulse Rate:  [71-98] 90 (04/29 0601) Resp:  [16] 16 (04/29 0601) BP: (144-153)/(70-86) 153/84 mmHg (04/29 0601) SpO2:  [97 %-100 %] 97 % (04/29 0601)  Intake/Output from previous day:  Intake/Output Summary (Last 24 hours) at 05/28/14 0755 Last data filed at 05/27/14 2219  Gross per 24 hour  Intake   2100 ml  Output      0 ml  Net   2100 ml    Intake/Output this shift:    Labs:  Recent Labs  05/27/14 0408 05/28/14 0434  HGB 11.1* 10.8*    Recent Labs  05/27/14 0408 05/28/14 0434  WBC 12.9* 13.0*  RBC 3.59* 3.50*  HCT 34.6* 33.5*  PLT 145* 148*    Recent Labs  05/27/14 0408 05/28/14 0434  NA 137 137  K 4.2 3.9  CL 105 105  CO2 27 27  BUN 11 13  CREATININE 0.62 0.61  GLUCOSE 152* 132*  CALCIUM 8.7 9.0   No results for input(s): LABPT, INR in the last 72 hours.  EXAM General - Patient is Alert, Appropriate and Oriented Extremity - Neurologically intact Neurovascular intact No cellulitis present Compartment soft Dressing/Incision - clean, dry, no drainage Motor Function - intact, moving foot and toes well on exam.    Past Medical History  Diagnosis Date  . Complication of anesthesia     2001- slow to wake up   . Family history of adverse reaction to anesthesia     father had reaction - became rigid after surgery - patient states they no longer use this anesthesia   . Hypertension   . Diabetes mellitus without complication   . Asthma     as a child   . Pneumonia     hx of pneumonia x 2   . History of kidney stones   . GERD (gastroesophageal reflux disease)   . Arthritis   . Headache     occasional   . Cancer     hx of skin cancer      Assessment/Plan: 2 Days Post-Op Procedure(s) (LRB): RIGHT TOTAL HIP ARTHROPLASTY ANTERIOR APPROACH (Right) Principal Problem:   OA (osteoarthritis) of hip   Discharge home with home health  DVT Prophylaxis - Xarelto Weight Bearing As Tolerated right Leg  Herbert Aguinaldo V 05/28/2014, 7:55 AM

## 2014-05-28 NOTE — Discharge Summary (Signed)
Physician Discharge Summary   Patient ID: Alan Butler MRN: [id number] DOB/AGE: 58-Aug-1957 58 y.o.  Admit date: 05/26/2014 Discharge date: 05-28-2014  Primary Diagnosis:  Osteoarthritis of the Right hip.  Admission Diagnoses:  Past Medical History  Diagnosis Date  . Complication of anesthesia     2001- slow to wake up   . Family history of adverse reaction to anesthesia     father had reaction - became rigid after surgery - patient states they no longer use this anesthesia   . Hypertension   . Diabetes mellitus without complication   . Asthma     as a child   . Pneumonia     hx of pneumonia x 2   . History of kidney stones   . GERD (gastroesophageal reflux disease)   . Arthritis   . Headache     occasional   . Cancer     hx of skin cancer    Discharge Diagnoses:   Principal Problem:   OA (osteoarthritis) of hip  Estimated body mass index is 26.98 kg/(m^2) as calculated from the following:   Height as of this encounter: '5\' 10"'  (1.778 m).   Weight as of this encounter: 85.276 kg (188 lb).  Procedure(s) (LRB): RIGHT TOTAL HIP ARTHROPLASTY ANTERIOR APPROACH (Right)   Consults: None  HPI: Alan Butler is a 59 y.o. male who has advanced end-  stage arthritis of his Right hip with progressively worsening pain and  dysfunction.The patient has failed nonoperative management and presents for  total hip arthroplasty.   Laboratory Data: Admission on 05/26/2014  Component Date Value Ref Range Status  . ABO/RH(D) 05/26/2014 O POS   Final  . Antibody Screen 05/26/2014 NEG   Final  . Sample Expiration 05/26/2014 05/29/2014   Final  . Glucose-Capillary 05/26/2014 128* 70 - 99 mg/dL Final  . Comment 1 05/26/2014 Notify RN   Final  . ABO/RH(D) 05/26/2014 O POS   Final  . Glucose-Capillary 05/26/2014 144* 70 - 99 mg/dL Final  . WBC 05/27/2014 12.9* 4.0 - 10.5 K/uL Final  . RBC 05/27/2014 3.59* 4.22 - 5.81 MIL/uL Final  . Hemoglobin 05/27/2014 11.1* 13.0 - 17.0 g/dL  Final  . HCT 05/27/2014 34.6* 39.0 - 52.0 % Final  . MCV 05/27/2014 96.4  78.0 - 100.0 fL Final  . MCH 05/27/2014 30.9  26.0 - 34.0 pg Final  . MCHC 05/27/2014 32.1  30.0 - 36.0 g/dL Final  . RDW 05/27/2014 12.8  11.5 - 15.5 % Final  . Platelets 05/27/2014 145* 150 - 400 K/uL Final  . Sodium 05/27/2014 137  135 - 145 mmol/L Final  . Potassium 05/27/2014 4.2  3.5 - 5.1 mmol/L Final  . Chloride 05/27/2014 105  96 - 112 mmol/L Final  . CO2 05/27/2014 27  19 - 32 mmol/L Final  . Glucose, Bld 05/27/2014 152* 70 - 99 mg/dL Final  . BUN 05/27/2014 11  6 - 23 mg/dL Final  . Creatinine, Ser 05/27/2014 0.62  0.50 - 1.35 mg/dL Final  . Calcium 05/27/2014 8.7  8.4 - 10.5 mg/dL Final  . GFR calc non Af Amer 05/27/2014 >90  >90 mL/min Final  . GFR calc Af Amer 05/27/2014 >90  >90 mL/min Final   Comment: (NOTE) The eGFR has been calculated using the CKD EPI equation. This calculation has not been validated in all clinical situations. eGFR's persistently <90 mL/min signify possible Chronic Kidney Disease.   . Anion gap 05/27/2014 5  5 - 15 Final  .  Glucose-Capillary 05/26/2014 160* 70 - 99 mg/dL Final  . Glucose-Capillary 05/26/2014 148* 70 - 99 mg/dL Final  . Comment 1 05/26/2014 Notify RN   Final  . Glucose-Capillary 05/27/2014 117* 70 - 99 mg/dL Final  . Comment 1 05/27/2014 Notify RN   Final  . Comment 2 05/27/2014 Document in Chart   Final  . Glucose-Capillary 05/27/2014 147* 70 - 99 mg/dL Final  . Comment 1 05/27/2014 Notify RN   Final  . Comment 2 05/27/2014 Document in Chart   Final  . WBC 05/28/2014 13.0* 4.0 - 10.5 K/uL Final  . RBC 05/28/2014 3.50* 4.22 - 5.81 MIL/uL Final  . Hemoglobin 05/28/2014 10.8* 13.0 - 17.0 g/dL Final  . HCT 05/28/2014 33.5* 39.0 - 52.0 % Final  . MCV 05/28/2014 95.7  78.0 - 100.0 fL Final  . MCH 05/28/2014 30.9  26.0 - 34.0 pg Final  . MCHC 05/28/2014 32.2  30.0 - 36.0 g/dL Final  . RDW 05/28/2014 12.9  11.5 - 15.5 % Final  . Platelets 05/28/2014 148* 150  - 400 K/uL Final  . Sodium 05/28/2014 137  135 - 145 mmol/L Final  . Potassium 05/28/2014 3.9  3.5 - 5.1 mmol/L Final  . Chloride 05/28/2014 105  96 - 112 mmol/L Final  . CO2 05/28/2014 27  19 - 32 mmol/L Final  . Glucose, Bld 05/28/2014 132* 70 - 99 mg/dL Final  . BUN 05/28/2014 13  6 - 23 mg/dL Final  . Creatinine, Ser 05/28/2014 0.61  0.50 - 1.35 mg/dL Final  . Calcium 05/28/2014 9.0  8.4 - 10.5 mg/dL Final  . GFR calc non Af Amer 05/28/2014 >90  >90 mL/min Final  . GFR calc Af Amer 05/28/2014 >90  >90 mL/min Final   Comment: (NOTE) The eGFR has been calculated using the CKD EPI equation. This calculation has not been validated in all clinical situations. eGFR's persistently <90 mL/min signify possible Chronic Kidney Disease.   . Anion gap 05/28/2014 5  5 - 15 Final  . Glucose-Capillary 05/27/2014 172* 70 - 99 mg/dL Final  . Comment 1 05/27/2014 Notify RN   Final  . Glucose-Capillary 05/27/2014 153* 70 - 99 mg/dL Final  . Comment 1 05/27/2014 Notify RN   Final  . Glucose-Capillary 05/28/2014 135* 70 - 99 mg/dL Final  . Comment 1 05/28/2014 Notify RN   Final  . Comment 2 05/28/2014 Document in Chart   Final  Hospital Outpatient Visit on 05/19/2014  Component Date Value Ref Range Status  . aPTT 05/19/2014 31  24 - 37 seconds Final   Performed at [facility]  . WBC 05/19/2014 6.2  4.0 - 10.5 K/uL Final  . RBC 05/19/2014 4.40  4.22 - 5.81 MIL/uL Final  . Hemoglobin 05/19/2014 13.8  13.0 - 17.0 g/dL Final  . HCT 05/19/2014 43.2  39.0 - 52.0 % Final  . MCV 05/19/2014 98.2  78.0 - 100.0 fL Final  . MCH 05/19/2014 31.4  26.0 - 34.0 pg Final  . MCHC 05/19/2014 31.9  30.0 - 36.0 g/dL Final  . RDW 05/19/2014 13.1  11.5 - 15.5 % Final  . Platelets 05/19/2014 172  150 - 400 K/uL Final  . Sodium 05/19/2014 140  135 - 145 mmol/L Final  . Potassium 05/19/2014 4.2  3.5 - 5.1 mmol/L Final  . Chloride 05/19/2014 104  96 - 112 mmol/L Final  . CO2 05/19/2014 28  19 - 32 mmol/L Final    . Glucose, Bld 05/19/2014 158* 70 - 99  mg/dL Final  . BUN 05/19/2014 15  6 - 23 mg/dL Final  . Creatinine, Ser 05/19/2014 0.74  0.50 - 1.35 mg/dL Final  . Calcium 05/19/2014 9.7  8.4 - 10.5 mg/dL Final  . Total Protein 05/19/2014 7.7  6.0 - 8.3 g/dL Final  . Albumin 05/19/2014 4.6  3.5 - 5.2 g/dL Final  . AST 05/19/2014 23  0 - 37 U/L Final  . ALT 05/19/2014 19  0 - 53 U/L Final  . Alkaline Phosphatase 05/19/2014 62  39 - 117 U/L Final  . Total Bilirubin 05/19/2014 1.0  0.3 - 1.2 mg/dL Final  . GFR calc non Af Amer 05/19/2014 >90  >90 mL/min Final  . GFR calc Af Amer 05/19/2014 >90  >90 mL/min Final   Comment: (NOTE) The eGFR has been calculated using the CKD EPI equation. This calculation has not been validated in all clinical situations. eGFR's persistently <90 mL/min signify possible Chronic Kidney Disease.   . Anion gap 05/19/2014 8  5 - 15 Final  . Prothrombin Time 05/19/2014 13.0  11.6 - 15.2 seconds Final  . INR 05/19/2014 0.97  0.00 - 1.49 Final   Performed at [facility]  . Color, Urine 05/19/2014 YELLOW  YELLOW Final  . APPearance 05/19/2014 CLEAR  CLEAR Final  . Specific Gravity, Urine 05/19/2014 1.010  1.005 - 1.030 Final  . pH 05/19/2014 6.0  5.0 - 8.0 Final  . Glucose, UA 05/19/2014 NEGATIVE  NEGATIVE mg/dL Final  . Hgb urine dipstick 05/19/2014 SMALL* NEGATIVE Final  . Bilirubin Urine 05/19/2014 NEGATIVE  NEGATIVE Final  . Ketones, ur 05/19/2014 NEGATIVE  NEGATIVE mg/dL Final  . Protein, ur 05/19/2014 NEGATIVE  NEGATIVE mg/dL Final  . Urobilinogen, UA 05/19/2014 1.0  0.0 - 1.0 mg/dL Final  . Nitrite 05/19/2014 NEGATIVE  NEGATIVE Final  . Leukocytes, UA 05/19/2014 NEGATIVE  NEGATIVE Final  . MRSA, PCR 05/19/2014 POSITIVE* NEGATIVE Final   Comment: RESULT CALLED TO, READ BACK BY AND VERIFIED WITH: Franz Dell RN AT 1150 ON 04.20.16 BY SHUEA   . Staphylococcus aureus 05/19/2014 POSITIVE* NEGATIVE Final   Comment:        The Xpert SA Assay (FDA approved for  NASAL specimens in patients over 3 years of age), is one component of a comprehensive surveillance program.  Test performance has been validated by [facility] for patients greater than or equal to 60 year old. It is not intended to diagnose infection nor to guide or monitor treatment.   . Squamous Epithelial / LPF 05/19/2014 RARE  RARE Final  . RBC / HPF 05/19/2014 0-2  <3 RBC/hpf Final     X-Rays:Dg Pelvis Portable  05/26/2014   CLINICAL DATA:  Status post right hip replacement today.  EXAM: PORTABLE PELVIS 1-2 VIEWS  COMPARISON:  MRI right hip 01/18/2006.  FINDINGS: The patient has a new right total hip replacement. The device is located and no fracture is identified. Gas in the soft tissues and surgical drain are noted. Left hip degenerative disease is seen.  IMPRESSION: New right total hip replacement without evidence of complication.   Electronically Signed   By: Inge Rise M.D.   On: 05/26/2014 09:40   Dg C-arm 1-60 Min-no Report  05/26/2014   CLINICAL DATA: surgery   C-ARM 1-60 MINUTES  Fluoroscopy was utilized by the requesting physician.  No radiographic  interpretation.     EKG:No orders found for this or any previous visit.   Hospital Course: Patient was admitted to [facility]  and taken to the OR and underwent the above state procedure without complications.  Patient tolerated the procedure well and was later transferred to the recovery room and then to the orthopaedic floor for postoperative care.  They were given PO and IV analgesics for pain control following their surgery.  They were given 24 hours of postoperative antibiotics of  Anti-infectives    Start     Dose/Rate Route Frequency Ordered Stop   05/26/14 1200  ceFAZolin (ANCEF) IVPB 2 g/50 mL premix     2 g 100 mL/hr over 30 Minutes Intravenous Every 6 hours 05/26/14 1113 05/26/14 1829   05/26/14 0530  vancomycin (VANCOCIN) IVPB 1000 mg/200 mL premix     1,000 mg 200 mL/hr over 60 Minutes  Intravenous  Once 05/26/14 0525 05/26/14 0733   05/25/14 0939  ceFAZolin (ANCEF) IVPB 2 g/50 mL premix  Status:  Discontinued     2 g 100 mL/hr over 30 Minutes Intravenous On call to O.R. 05/25/14 2330 05/25/14 0939     and started on DVT prophylaxis in the form of Xarelto.   PT and OT were ordered for total hip protocol.  The patient was allowed to be WBAT with therapy. Discharge planning was consulted to help with postop disposition and equipment needs.  Patient had a tough night on the evening of surgery with pain.  They started to get up OOB with therapy on day one.  Hemovac drain was pulled without difficulty.  Continued to work with therapy into day two.  Dressing was changed on day two and the incision was healing well. Patient was seen in rounds by Dr. Wynelle Link and was ready to go home.  Diet: Cardiac diet Activity:WBAT Follow-up:in 2 weeks Disposition - Home Discharged Condition: good      Discharge Instructions    Call MD / Call 911    Complete by:  As directed   If you experience chest pain or shortness of breath, CALL 911 and be transported to the hospital emergency room.  If you develope a fever above 101 F, pus (white drainage) or increased drainage or redness at the wound, or calf pain, call your surgeon's office.     Change dressing    Complete by:  As directed   You may change your dressing dressing daily with sterile 4 x 4 inch gauze dressing and paper tape.  Do not submerge the incision under water.     Constipation Prevention    Complete by:  As directed   Drink plenty of fluids.  Prune juice may be helpful.  You may use a stool softener, such as Colace (over the counter) 100 mg twice a day.  Use MiraLax (over the counter) for constipation as needed.     Diet - low sodium heart healthy    Complete by:  As directed      Discharge instructions    Complete by:  As directed   Pick up stool softner and laxative for home use following surgery while on pain medications. Do  not submerge incision under water. Please use good hand washing techniques while changing dressing each day. May shower starting three days after surgery. Please use a clean towel to pat the incision dry following showers. Continue to use ice for pain and swelling after surgery. Do not use any lotions or creams on the incision until instructed by your surgeon.  Total Hip Protocol.  Take Xarelto for two and a half more weeks, then discontinue Xarelto. Once the  patient has completed the Xarelto, they may resume the 81 mg Aspirin.  Postoperative Constipation Protocol  Constipation - defined medically as fewer than three stools per week and severe constipation as less than one stool per week.  One of the most common issues patients have following surgery is constipation.  Even if you have a regular bowel pattern at home, your normal regimen is likely to be disrupted due to multiple reasons following surgery.  Combination of anesthesia, postoperative narcotics, change in appetite and fluid intake all can affect your bowels.  In order to avoid complications following surgery, here are some recommendations in order to help you during your recovery period.  Colace (docusate) - Pick up an over-the-counter form of Colace or another stool softener and take twice a day as long as you are requiring postoperative pain medications.  Take with a full glass of water daily.  If you experience loose stools or diarrhea, hold the colace until you stool forms back up.  If your symptoms do not get better within 1 week or if they get worse, check with your doctor.  Dulcolax (bisacodyl) - Pick up over-the-counter and take as directed by the product packaging as needed to assist with the movement of your bowels.  Take with a full glass of water.  Use this product as needed if not relieved by Colace only.   MiraLax (polyethylene glycol) - Pick up over-the-counter to have on hand.  MiraLax is a solution that will increase  the amount of water in your bowels to assist with bowel movements.  Take as directed and can mix with a glass of water, juice, soda, coffee, or tea.  Take if you go more than two days without a movement. Do not use MiraLax more than once per day. Call your doctor if you are still constipated or irregular after using this medication for 7 days in a row.  If you continue to have problems with postoperative constipation, please contact the office for further assistance and recommendations.  If you experience "the worst abdominal pain ever" or develop nausea or vomiting, please contact the office immediatly for further recommendations for treatment.     Do not sit on low chairs, stoools or toilet seats, as it may be difficult to get up from low surfaces    Complete by:  As directed      Driving restrictions    Complete by:  As directed   No driving until released by the physician.     Increase activity slowly as tolerated    Complete by:  As directed      Lifting restrictions    Complete by:  As directed   No lifting until released by the physician.     Patient may shower    Complete by:  As directed   You may shower without a dressing once there is no drainage.  Do not wash over the wound.  If drainage remains, do not shower until drainage stops.     TED hose    Complete by:  As directed   Use stockings (TED hose) for 3 weeks on both leg(s).  You may remove them at night for sleeping.     Weight bearing as tolerated    Complete by:  As directed   Laterality:  right  Extremity:  Lower            Medication List    STOP taking these medications        aspirin  EC 81 MG tablet     clobetasol cream 0.05 %  Commonly known as:  TEMOVATE     multivitamin with minerals Tabs tablet     naproxen sodium 220 MG tablet  Commonly known as:  ANAPROX     Vitamin D 2000 UNITS tablet      TAKE these medications        albuterol 108 (90 BASE) MCG/ACT inhaler  Commonly known as:  PROVENTIL  HFA;VENTOLIN HFA  Inhale 1 puff into the lungs every 6 (six) hours as needed for wheezing or shortness of breath.     cetirizine 10 MG tablet  Commonly known as:  ZYRTEC  Take 10 mg by mouth daily.     fosinopril 40 MG tablet  Commonly known as:  MONOPRIL  Take 40 mg by mouth every morning.     metFORMIN 1000 MG tablet  Commonly known as:  GLUCOPHAGE  Take 1,000 mg by mouth 2 (two) times daily with a meal.     methocarbamol 500 MG tablet  Commonly known as:  ROBAXIN  Take 1 tablet (500 mg total) by mouth every 6 (six) hours as needed for muscle spasms.     oxyCODONE 5 MG immediate release tablet  Commonly known as:  Oxy IR/ROXICODONE  Take 1-2 tablets (5-10 mg total) by mouth every 3 (three) hours as needed for moderate pain, severe pain or breakthrough pain.     pantoprazole 40 MG tablet  Commonly known as:  PROTONIX  Take 40 mg by mouth daily.     rivaroxaban 10 MG Tabs tablet  Commonly known as:  XARELTO  - Take 1 tablet (10 mg total) by mouth daily with breakfast. Take Xarelto for two and a half more weeks, then discontinue Xarelto.  - Once the patient has completed the Xarelto, they may resume the 81 mg Aspirin.     simvastatin 40 MG tablet  Commonly known as:  ZOCOR  Take 40 mg by mouth daily.     traMADol 50 MG tablet  Commonly known as:  ULTRAM  Take 1-2 tablets (50-100 mg total) by mouth every 6 (six) hours as needed (mild pain).       Follow-up Information    Follow up with [facility].   Why:  rolling walker   Contact information:   [address] [place] [address] [phone]       Follow up with [facility].   Why:  home health physical therapy   Contact information:   [place] [address] [phone]       Follow up with Gearlean Alf, MD. Schedule an appointment as soon as possible for a visit on 06/07/2014.   Specialty:  Orthopedic Surgery   Why:  Call office at [phone] to  setup appointment with Dr. Wynelle Link on Monday 06/07/2014.   Contact information:   [address] [place] [address] [phone]       Signed: Arlee Muslim, PA-C Orthopaedic Surgery 05/28/2014, 8:48 AM

## 2014-05-28 NOTE — Progress Notes (Signed)
Physical Therapy Treatment Patient Details Name: Alan Butler MRN: [id number] DOB: 12-09-1955 Today's Date: 05/28/2014    History of Present Illness s/p R DA THA    PT Comments    Pt making excellent progress  Follow Up Recommendations  Home health PT     Equipment Recommendations  Rolling walker with 5" wheels    Recommendations for Other Services       Precautions / Restrictions Precautions Precautions: Fall Restrictions Weight Bearing Restrictions: No Other Position/Activity Restrictions: WBAT    Mobility  Bed Mobility Overal bed mobility: Needs Assistance             General bed mobility comments: pt in recliner  Transfers Overall transfer level: Modified independent Equipment used: Rolling walker (2 wheeled) Transfers: Sit to/from Stand Sit to Stand: Modified independent (Device/Increase time)         General transfer comment: cues for RLE placement; extra time  Ambulation/Gait Ambulation/Gait assistance: Supervision Ambulation Distance (Feet): 120 Feet Assistive device: Rolling walker (2 wheeled) Gait Pattern/deviations: Step-to pattern;Antalgic;Trunk flexed     General Gait Details: occasional cues for RW position   Stairs            Wheelchair Mobility    Modified Rankin (Stroke Patients Only)       Balance                                    Cognition Arousal/Alertness: Awake/alert Behavior During Therapy: WFL for tasks assessed/performed Overall Cognitive Status: Within Functional Limits for tasks assessed                      Exercises Total Joint Exercises Ankle Circles/Pumps: AROM;Both;10 reps Quad Sets: 10 reps;Both;AROM Heel Slides: AAROM;Right;10 reps (pt self assists with sheet) Hip ABduction/ADduction: AAROM;Right;10 reps (pt self assist )    General Comments        Pertinent Vitals/Pain Pain Assessment: 0-10 Pain Score: 4  Pain Location: r hip Pain Descriptors / Indicators:  Aching;Sore Pain Intervention(s): Limited activity within patient's tolerance;Monitored during session;Premedicated before session;Ice applied    Home Living                      Prior Function            PT Goals (current goals can now be found in the care plan section) Acute Rehab PT Goals Patient Stated Goal: to get back to work PT Goal Formulation: With patient Time For Goal Achievement: 05/29/14 Potential to Achieve Goals: Good Progress towards PT goals: Progressing toward goals    Frequency  7X/week    PT Plan Current plan remains appropriate    Co-evaluation             End of Session Equipment Utilized During Treatment: Gait belt Activity Tolerance: Patient tolerated treatment well Patient left: in chair;with call bell/phone within reach     Time: 1027-1044 PT Time Calculation (min) (ACUTE ONLY): 17 min  Charges:  $Gait Training: 8-22 mins                    G Codes:      Elizabet Schweppe 05/28/2014, 10:54 AM

## 2015-10-24 ENCOUNTER — Other Ambulatory Visit: Payer: Self-pay | Admitting: Orthopedic Surgery

## 2015-11-30 DIAGNOSIS — G5621 Lesion of ulnar nerve, right upper limb: Secondary | ICD-10-CM

## 2015-11-30 DIAGNOSIS — G5601 Carpal tunnel syndrome, right upper limb: Secondary | ICD-10-CM

## 2015-11-30 HISTORY — DX: Lesion of ulnar nerve, right upper limb: G56.21

## 2015-11-30 HISTORY — DX: Carpal tunnel syndrome, right upper limb: G56.01

## 2015-12-06 ENCOUNTER — Encounter (HOSPITAL_BASED_OUTPATIENT_CLINIC_OR_DEPARTMENT_OTHER): Payer: Self-pay | Admitting: *Deleted

## 2015-12-13 ENCOUNTER — Ambulatory Visit (HOSPITAL_BASED_OUTPATIENT_CLINIC_OR_DEPARTMENT_OTHER): Payer: 59 | Admitting: Anesthesiology

## 2015-12-13 ENCOUNTER — Encounter (HOSPITAL_BASED_OUTPATIENT_CLINIC_OR_DEPARTMENT_OTHER): Payer: Self-pay | Admitting: *Deleted

## 2015-12-13 ENCOUNTER — Ambulatory Visit (HOSPITAL_BASED_OUTPATIENT_CLINIC_OR_DEPARTMENT_OTHER)
Admission: RE | Admit: 2015-12-13 | Discharge: 2015-12-13 | Disposition: A | Discharge status: Home / Self Care | Payer: 59 | Source: Ambulatory Visit | Attending: Orthopedic Surgery | Admitting: Orthopedic Surgery

## 2015-12-13 ENCOUNTER — Encounter (HOSPITAL_BASED_OUTPATIENT_CLINIC_OR_DEPARTMENT_OTHER)
Admission: RE | Disposition: A | Discharge status: Home / Self Care | Payer: Self-pay | Source: Ambulatory Visit | Attending: Orthopedic Surgery

## 2015-12-13 DIAGNOSIS — E119 Type 2 diabetes mellitus without complications: Secondary | ICD-10-CM | POA: Insufficient documentation

## 2015-12-13 DIAGNOSIS — Z87442 Personal history of urinary calculi: Secondary | ICD-10-CM | POA: Insufficient documentation

## 2015-12-13 DIAGNOSIS — Z8719 Personal history of other diseases of the digestive system: Secondary | ICD-10-CM | POA: Diagnosis not present

## 2015-12-13 DIAGNOSIS — Z7984 Long term (current) use of oral hypoglycemic drugs: Secondary | ICD-10-CM | POA: Diagnosis not present

## 2015-12-13 DIAGNOSIS — I10 Essential (primary) hypertension: Secondary | ICD-10-CM | POA: Diagnosis not present

## 2015-12-13 DIAGNOSIS — Z833 Family history of diabetes mellitus: Secondary | ICD-10-CM | POA: Insufficient documentation

## 2015-12-13 DIAGNOSIS — M1611 Unilateral primary osteoarthritis, right hip: Secondary | ICD-10-CM | POA: Insufficient documentation

## 2015-12-13 DIAGNOSIS — K219 Gastro-esophageal reflux disease without esophagitis: Secondary | ICD-10-CM | POA: Diagnosis not present

## 2015-12-13 DIAGNOSIS — Z96641 Presence of right artificial hip joint: Secondary | ICD-10-CM | POA: Diagnosis not present

## 2015-12-13 DIAGNOSIS — Z7982 Long term (current) use of aspirin: Secondary | ICD-10-CM | POA: Insufficient documentation

## 2015-12-13 DIAGNOSIS — G5621 Lesion of ulnar nerve, right upper limb: Secondary | ICD-10-CM | POA: Diagnosis not present

## 2015-12-13 DIAGNOSIS — J45909 Unspecified asthma, uncomplicated: Secondary | ICD-10-CM | POA: Diagnosis not present

## 2015-12-13 DIAGNOSIS — G5601 Carpal tunnel syndrome, right upper limb: Secondary | ICD-10-CM | POA: Insufficient documentation

## 2015-12-13 HISTORY — DX: Lesion of ulnar nerve, right upper limb: G56.21

## 2015-12-13 HISTORY — DX: Osteoarthritis of hip, unspecified: M16.9

## 2015-12-13 HISTORY — DX: Carpal tunnel syndrome, right upper limb: G56.01

## 2015-12-13 HISTORY — PX: CARPAL TUNNEL RELEASE: SHX101

## 2015-12-13 HISTORY — DX: Personal history of other diseases of the digestive system: Z87.19

## 2015-12-13 HISTORY — DX: Personal history of peptic ulcer disease: Z87.11

## 2015-12-13 HISTORY — PX: ULNAR NERVE TRANSPOSITION: SHX2595

## 2015-12-13 HISTORY — DX: Anemia, unspecified: D64.9

## 2015-12-13 HISTORY — DX: Type 2 diabetes mellitus without complications: E11.9

## 2015-12-13 LAB — POCT I-STAT, CHEM 8
BUN: 15 mg/dL (ref 6–20)
CALCIUM ION: 1.19 mmol/L (ref 1.15–1.40)
CREATININE: 0.8 mg/dL (ref 0.61–1.24)
Chloride: 104 mmol/L (ref 101–111)
GLUCOSE: 132 mg/dL — AB (ref 65–99)
HCT: 41 % (ref 39.0–52.0)
HEMOGLOBIN: 13.9 g/dL (ref 13.0–17.0)
Potassium: 3.7 mmol/L (ref 3.5–5.1)
Sodium: 143 mmol/L (ref 135–145)
TCO2: 25 mmol/L (ref 0–100)

## 2015-12-13 SURGERY — ULNAR NERVE DECOMPRESSION/TRANSPOSITION
Anesthesia: General | Site: Hand | Laterality: Right

## 2015-12-13 MED ORDER — MIDAZOLAM HCL 2 MG/2ML IJ SOLN: 1.0000 mg | INTRAMUSCULAR | Status: DC | PRN

## 2015-12-13 MED ORDER — CEFAZOLIN SODIUM-DEXTROSE 2-4 GM/100ML-% IV SOLN
2.0000 g | INTRAVENOUS | Status: AC
  Administered 2015-12-13: 2 g via INTRAVENOUS

## 2015-12-13 MED ORDER — OXYCODONE HCL 5 MG PO TABS
ORAL_TABLET | ORAL | Status: AC
  Filled 2015-12-13: qty 1

## 2015-12-13 MED ORDER — SCOPOLAMINE 1 MG/3DAYS TD PT72: 1.0000 | MEDICATED_PATCH | Freq: Once | TRANSDERMAL | Status: DC | PRN

## 2015-12-13 MED ORDER — FENTANYL CITRATE (PF) 100 MCG/2ML IJ SOLN
INTRAMUSCULAR | Status: AC
  Filled 2015-12-13: qty 2

## 2015-12-13 MED ORDER — ONDANSETRON HCL 4 MG/2ML IJ SOLN
INTRAMUSCULAR | Status: AC
  Filled 2015-12-13: qty 2

## 2015-12-13 MED ORDER — DEXAMETHASONE SODIUM PHOSPHATE 10 MG/ML IJ SOLN
INTRAMUSCULAR | Status: AC
  Filled 2015-12-13: qty 1

## 2015-12-13 MED ORDER — PROMETHAZINE HCL 25 MG/ML IJ SOLN: 6.2500 mg | INTRAMUSCULAR | Status: DC | PRN

## 2015-12-13 MED ORDER — HYDROMORPHONE HCL 1 MG/ML IJ SOLN: 0.2500 mg | INTRAMUSCULAR | Status: DC | PRN

## 2015-12-13 MED ORDER — CHLORHEXIDINE GLUCONATE 4 % EX LIQD: 60.0000 mL | Freq: Once | CUTANEOUS | Status: DC

## 2015-12-13 MED ORDER — DEXAMETHASONE SODIUM PHOSPHATE 4 MG/ML IJ SOLN
INTRAMUSCULAR | Status: DC | PRN
  Administered 2015-12-13: 10 mg via INTRAVENOUS

## 2015-12-13 MED ORDER — LIDOCAINE 2% (20 MG/ML) 5 ML SYRINGE
INTRAMUSCULAR | Status: DC | PRN
  Administered 2015-12-13: 100 mg via INTRAVENOUS

## 2015-12-13 MED ORDER — CEFAZOLIN SODIUM-DEXTROSE 2-4 GM/100ML-% IV SOLN
INTRAVENOUS | Status: AC
  Filled 2015-12-13: qty 100

## 2015-12-13 MED ORDER — HYDROCODONE-ACETAMINOPHEN 10-325 MG PO TABS: 1.0000 | ORAL_TABLET | Freq: Four times a day (QID) | ORAL | 0 refills | Status: DC | PRN

## 2015-12-13 MED ORDER — MIDAZOLAM HCL 2 MG/2ML IJ SOLN
INTRAMUSCULAR | Status: AC
  Filled 2015-12-13: qty 2

## 2015-12-13 MED ORDER — LACTATED RINGERS IV SOLN
INTRAVENOUS | Status: DC
  Administered 2015-12-13: 07:00:00 via INTRAVENOUS

## 2015-12-13 MED ORDER — ONDANSETRON HCL 4 MG/2ML IJ SOLN
INTRAMUSCULAR | Status: DC | PRN
  Administered 2015-12-13: 4 mg via INTRAVENOUS

## 2015-12-13 MED ORDER — BUPIVACAINE HCL (PF) 0.25 % IJ SOLN
INTRAMUSCULAR | Status: DC | PRN
  Administered 2015-12-13: 10 mL

## 2015-12-13 MED ORDER — OXYCODONE HCL 5 MG PO TABS
5.0000 mg | ORAL_TABLET | Freq: Once | ORAL | Status: AC | PRN
  Administered 2015-12-13: 5 mg via ORAL

## 2015-12-13 MED ORDER — PROPOFOL 10 MG/ML IV BOLUS
INTRAVENOUS | Status: DC | PRN
Start: 2015-12-13 — End: 2015-12-13
  Administered 2015-12-13: 150 mg via INTRAVENOUS

## 2015-12-13 MED ORDER — BUPIVACAINE HCL (PF) 0.25 % IJ SOLN
INTRAMUSCULAR | Status: AC
  Filled 2015-12-13: qty 150

## 2015-12-13 MED ORDER — FENTANYL CITRATE (PF) 100 MCG/2ML IJ SOLN
50.0000 ug | INTRAMUSCULAR | Status: DC | PRN
  Administered 2015-12-13: 50 ug via INTRAVENOUS

## 2015-12-13 MED ORDER — LIDOCAINE 2% (20 MG/ML) 5 ML SYRINGE
INTRAMUSCULAR | Status: AC
  Filled 2015-12-13: qty 5

## 2015-12-13 SURGICAL SUPPLY — 50 items
BLADE MINI RND TIP GREEN BEAV (BLADE) IMPLANT
BLADE SURG 15 STRL LF DISP TIS (BLADE) ×2 IMPLANT
BLADE SURG 15 STRL SS (BLADE) ×8
BNDG CMPR 9X4 STRL LF SNTH (GAUZE/BANDAGES/DRESSINGS) ×2
BNDG COHESIVE 3X5 TAN STRL LF (GAUZE/BANDAGES/DRESSINGS) ×8 IMPLANT
BNDG ESMARK 4X9 LF (GAUZE/BANDAGES/DRESSINGS) ×4 IMPLANT
BNDG GAUZE ELAST 4 BULKY (GAUZE/BANDAGES/DRESSINGS) ×4 IMPLANT
CHLORAPREP W/TINT 26ML (MISCELLANEOUS) ×4 IMPLANT
CORDS BIPOLAR (ELECTRODE) ×4 IMPLANT
COVER BACK TABLE 60X90IN (DRAPES) ×4 IMPLANT
COVER MAYO STAND STRL (DRAPES) ×4 IMPLANT
CUFF TOURN SGL LL 18 NRW (TOURNIQUET CUFF) ×4 IMPLANT
CUFF TOURNIQUET SINGLE 18IN (TOURNIQUET CUFF) ×4 IMPLANT
DECANTER SPIKE VIAL GLASS SM (MISCELLANEOUS) IMPLANT
DRAPE EXTREMITY T 121X128X90 (DRAPE) ×4 IMPLANT
DRAPE SURG 17X23 STRL (DRAPES) ×4 IMPLANT
DRSG PAD ABDOMINAL 8X10 ST (GAUZE/BANDAGES/DRESSINGS) ×4 IMPLANT
GAUZE SPONGE 4X4 12PLY STRL (GAUZE/BANDAGES/DRESSINGS) ×4 IMPLANT
GAUZE SPONGE 4X4 16PLY XRAY LF (GAUZE/BANDAGES/DRESSINGS) IMPLANT
GAUZE XEROFORM 1X8 LF (GAUZE/BANDAGES/DRESSINGS) ×4 IMPLANT
GLOVE BIOGEL PI IND STRL 7.0 (GLOVE) IMPLANT
GLOVE BIOGEL PI IND STRL 8.5 (GLOVE) ×2 IMPLANT
GLOVE BIOGEL PI INDICATOR 7.0 (GLOVE) ×4
GLOVE BIOGEL PI INDICATOR 8.5 (GLOVE) ×2
GLOVE ECLIPSE 6.5 STRL STRAW (GLOVE) ×2 IMPLANT
GLOVE SURG ORTHO 8.0 STRL STRW (GLOVE) ×4 IMPLANT
GOWN STRL REUS W/ TWL LRG LVL3 (GOWN DISPOSABLE) ×2 IMPLANT
GOWN STRL REUS W/TWL LRG LVL3 (GOWN DISPOSABLE) ×4
GOWN STRL REUS W/TWL XL LVL3 (GOWN DISPOSABLE) ×4 IMPLANT
LOOP VESSEL MAXI BLUE (MISCELLANEOUS) IMPLANT
NDL PRECISIONGLIDE 27X1.5 (NEEDLE) IMPLANT
NEEDLE PRECISIONGLIDE 27X1.5 (NEEDLE) ×4 IMPLANT
NS IRRIG 1000ML POUR BTL (IV SOLUTION) ×4 IMPLANT
PACK BASIN DAY SURGERY FS (CUSTOM PROCEDURE TRAY) ×4 IMPLANT
PAD CAST 3X4 CTTN HI CHSV (CAST SUPPLIES) IMPLANT
PAD CAST 4YDX4 CTTN HI CHSV (CAST SUPPLIES) ×2 IMPLANT
PADDING CAST COTTON 3X4 STRL (CAST SUPPLIES)
PADDING CAST COTTON 4X4 STRL (CAST SUPPLIES) ×4
SLEEVE SCD COMPRESS KNEE MED (MISCELLANEOUS) ×4 IMPLANT
SPLINT PLASTER CAST XFAST 3X15 (CAST SUPPLIES) IMPLANT
SPLINT PLASTER XTRA FASTSET 3X (CAST SUPPLIES)
STOCKINETTE 4X48 STRL (DRAPES) ×4 IMPLANT
SUT ETHILON 4 0 PS 2 18 (SUTURE) ×4 IMPLANT
SUT VIC AB 2-0 SH 27 (SUTURE) ×4
SUT VIC AB 2-0 SH 27XBRD (SUTURE) ×2 IMPLANT
SUT VICRYL 4-0 PS2 18IN ABS (SUTURE) ×4 IMPLANT
SYR BULB 3OZ (MISCELLANEOUS) ×4 IMPLANT
SYR CONTROL 10ML LL (SYRINGE) ×2 IMPLANT
TOWEL OR 17X24 6PK STRL BLUE (TOWEL DISPOSABLE) ×4 IMPLANT
UNDERPAD 30X30 (UNDERPADS AND DIAPERS) ×4 IMPLANT

## 2015-12-13 NOTE — Anesthesia Preprocedure Evaluation (Addendum)
Anesthesia Evaluation  Patient identified by MRN, date of birth, ID band Patient awake    Reviewed: Allergy & Precautions, NPO status , Patient's Chart, lab work & pertinent test results  History of Anesthesia Complications (+) PROLONGED EMERGENCE and Family history of anesthesia reaction  Airway Mallampati: II  TM Distance: >3 FB Neck ROM: Full    Dental  (+) Teeth Intact, Dental Advisory Given   Pulmonary asthma ,    breath sounds clear to auscultation       Cardiovascular hypertension,  Rhythm:Regular Rate:Normal     Neuro/Psych    GI/Hepatic GERD  ,  Endo/Other  diabetes, Type 2  Renal/GU      Musculoskeletal  (+) Arthritis ,   Abdominal   Peds  Hematology   Anesthesia Other Findings   Reproductive/Obstetrics                            Anesthesia Physical Anesthesia Plan  ASA: III  Anesthesia Plan: General   Post-op Pain Management:    Induction: Intravenous  Airway Management Planned: LMA  Additional Equipment:   Intra-op Plan:   Post-operative Plan: Extubation in OR  Informed Consent: I have reviewed the patients History and Physical, chart, labs and discussed the procedure including the risks, benefits and alternatives for the proposed anesthesia with the patient or authorized representative who has indicated his/her understanding and acceptance.   Dental advisory given  Plan Discussed with: CRNA  Anesthesia Plan Comments:         Anesthesia Quick Evaluation

## 2015-12-13 NOTE — Brief Op Note (Signed)
12/13/2015  9:40 AM  PATIENT:  Alan Butler  60 y.o. male  PRE-OPERATIVE DIAGNOSIS:  cubital tunnel syndrome right  POST-OPERATIVE DIAGNOSIS:  cubital tunnel syndrome right  PROCEDURE:  Procedure(s): ULNAR NERVE DECOMPRESSION (Right) CARPAL TUNNEL RELEASE, right (Right)  SURGEON:  Surgeon(s) and Role:    * [name] Alayza Pieper, MD - Primary  PHYSICIAN ASSISTANT:   ASSISTANTS: none   ANESTHESIA:   local and general  EBL:  Total I/O In: 900 [I.V.:900] Out: 2 [Blood:2]  BLOOD ADMINISTERED:none  DRAINS: none   LOCAL MEDICATIONS USED:  BUPIVICAINE   SPECIMEN:  No Specimen  DISPOSITION OF SPECIMEN:  none  COUNTS:  YES  TOURNIQUET:   Total Tourniquet Time Documented: area (laterality) - 33 minutes Total: area (laterality) - 33 minutes   DICTATION: .Other Dictation: Dictation Number [id number]  PLAN OF CARE: Discharge to home after PACU  PATIENT DISPOSITION:  PACU - hemodynamically stable.

## 2015-12-13 NOTE — Discharge Instructions (Addendum)

## 2015-12-13 NOTE — Anesthesia Procedure Notes (Signed)
Procedure Name: LMA Insertion Date/Time: 12/13/2015 8:48 AM Performed by: Gar GibbonKEETON, Teran Knittle S Pre-anesthesia Checklist: Patient identified, Emergency Drugs available, Suction available and Patient being monitored Patient Re-evaluated:Patient Re-evaluated prior to inductionOxygen Delivery Method: Circle system utilized Preoxygenation: Pre-oxygenation with 100% oxygen Intubation Type: IV induction Ventilation: Mask ventilation without difficulty LMA: LMA inserted LMA Size: 4.0 Number of attempts: 1 Airway Equipment and Method: Bite block Placement Confirmation: positive ETCO2 Tube secured with: Tape Dental Injury: Teeth and Oropharynx as per pre-operative assessment

## 2015-12-13 NOTE — Op Note (Signed)
NAME:  Alan Butler, Alan Butler              ACCOUNT NO.:  [id number]  MEDICAL RECORD NO.:  [id number]  LOCATION:                                 FACILITY:  PHYSICIAN:  [name] Evagelia Knack, M.D.            DATE OF BIRTH:  DATE OF PROCEDURE:  12/13/2015 DATE OF DISCHARGE:                              OPERATIVE REPORT   PREOPERATIVE DIAGNOSES: 1. Carpal tunnel syndrome, right hand. 2. Cubital tunnel syndrome, right elbow.  POSTOPERATIVE DIAGNOSES: 1. Carpal tunnel syndrome, right hand. 2. Cubital tunnel syndrome, right elbow.  OPERATION: 1. Decompression of median nerve, right wrist. 2. Decompression of ulnar nerve, right elbow.  SURGEON:  [name] Xabi Wittler, MD.  ANESTHESIA:  General with local infiltration.  ANESTHESIOLOGIST:  Dr. Jacklynn BueMassagee.  HISTORY:  The patient is a 60 year old male with a history of numbness and tingling to his right hand.  He has undergone nerve conductions and local care.  The nerve conductions revealed that he has changes on his median and ulnar nerve on both EMG nerve conduction and ultrasound.  We have discussed release of the nerves with possible transposition to the ulnar nerve at the elbow.  He is aware that there is no guarantee to the surgery, the possibility of infection, recurrence of injury to arteries, nerves, tendons, incomplete relief of symptoms and dystrophy.  He is advised that we are attempting to hold the process at his elbow and hopefully allow this to get better.  In the preoperative area, the patient is seen, the extremity was marked by both patient and surgeon and antibiotic given.  PROCEDURE IN DETAIL:  The patient was brought to the operating room, where general anesthetic was carried out without difficulty under the direction of Dr. Jacklynn BueMassagee.  He was prepped using ChloraPrep in a supine position with the right arm free.  The limb was exsanguinated with an Esmarch bandage.  Tourniquet placed on the upper arm, was inflated to 250 mmHg.  A  longitudinal incision was made in the right palm, carried down through subcutaneous tissue.  Bleeders were electrocauterized with bipolar.  The palmar fascia was split.  The superficial palmar arch was identified.  The flexor tendon to the ring and little finger was identified.  Retractors were placed pulling the flexor tendon median nerve radially and the ulnar nerve ulnarly.  The flexor retinaculum was then incised on its ulnar border with sharp dissection.  A right angle and Sewall retractor were placed between the skin and forearm fascia proximally.  The median nerve was dissected free from the overlying forearm fascia.  Blunt scissors were then used to release the distal forearm fascia for approximately 2 cm proximal to the wrist crease under direct vision.  Canal was explored.  Area of compression to the nerve was apparent.  Motor branch was noted under nerve muscle distally.  The wound was copiously irrigated with saline.  The skin was closed with interrupted 4-0 nylon sutures.  A separate incision was then made over the medial epicondyle of the right elbow.  This carried down through subcutaneous tissue.  This incision was approximately 5 cm in length. The ulnar nerve was identified after releasing Osborne's fascia.  Skin and subcutaneous tissue were then dissected free from the underlying flexor carpi ulnaris fascia.  A knee retractor was placed allowing visualization of the forearm fascia.  The superficial fascia of the flexor carpi ulnaris was then released for approximately 10 cm distally using ENT scissors.  The muscle was then split.  The deep fascia was then released after placing a KMI guide for carpal tunnel release protecting the nerve and using the angled scissors to release the deep fascia.  The nerve was noted to be intact over its entire length. Proximally, the nerve was then attended to.  The skin subcutaneous tissue was separated from the brachial fascia proximally.   The knee retractors were placed allowing visualization of the brachial fascia. This was then released protecting the nerve with the [facility] guide.  The nerve was released for approximately 8 cm proximally.  No further lesions were noted about the nerve.  The elbow was fully flexed and there was no subluxation or dislocation to the nerve over the epicondyle.  The wound was copiously irrigated with saline.  Bleeders were electrocauterized with bipolar.  The subcutaneous tissue was closed with interrupted 4-0 Vicryl suture.  The skin was closed with interrupted 4-0 nylon sutures.  A local infiltration with 0.25% bupivacaine without epinephrine was given to each wound with a total of 10 mL.  A sterile compressive dressing was applied to the elbow.  On deflation of the tourniquet, all fingers immediately pinked.  He was taken to the recovery room for observation in satisfactory condition. He will be discharged home [facility] week, on [place].          ______________________________ [name] Skylynne Schlechter, M.D.     GK/MEDQ  D:  12/13/2015  T:  12/13/2015  Job:  [id number]

## 2015-12-13 NOTE — Op Note (Signed)
Dictation Number [id number]
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
## 2015-12-13 NOTE — Anesthesia Postprocedure Evaluation (Signed)
Anesthesia Post Note  Patient: Alan Butler  Procedure(s) Performed: Procedure(s) (LRB): ULNAR NERVE DECOMPRESSION (Right) CARPAL TUNNEL RELEASE, right (Right)  Patient location during evaluation: PACU Anesthesia Type: General Level of consciousness: awake and alert Pain management: pain level controlled Vital Signs Assessment: post-procedure vital signs reviewed and stable Respiratory status: spontaneous breathing, nonlabored ventilation, respiratory function stable and patient connected to nasal cannula oxygen Cardiovascular status: blood pressure returned to baseline and stable Postop Assessment: no signs of nausea or vomiting Anesthetic complications: no    Last Vitals:  Vitals:   12/13/15 1015 12/13/15 1045  BP: 136/84 (!) 142/75  Pulse: 82 70  Resp: 18 16  Temp:  36.4 C    Last Pain:  Vitals:   12/13/15 1100  TempSrc:   PainSc: 4                  Andrik Sandt,JAMES TERRILL

## 2015-12-13 NOTE — Transfer of Care (Signed)
Immediate Anesthesia Transfer of Care Note  Patient: Alan Butler  Procedure(s) Performed: Procedure(s): ULNAR NERVE DECOMPRESSION (Right) CARPAL TUNNEL RELEASE, right (Right)  Patient Location: PACU  Anesthesia Type:General  Level of Consciousness: awake, sedated and patient cooperative  Airway & Oxygen Therapy: Patient Spontanous Breathing and Patient connected to face mask oxygen  Post-op Assessment: Report given to RN and Post -op Vital signs reviewed and stable  Post vital signs: Reviewed and stable  Last Vitals:  Vitals:   12/13/15 0644  BP: (!) 142/87  Pulse: 68  Resp: 18  Temp: 36.4 C    Last Pain:  Vitals:   12/13/15 0644  TempSrc: Oral  PainSc:          Complications: No apparent anesthesia complications

## 2015-12-14 ENCOUNTER — Encounter (HOSPITAL_BASED_OUTPATIENT_CLINIC_OR_DEPARTMENT_OTHER): Payer: Self-pay | Admitting: Orthopedic Surgery

## 2016-01-27 ENCOUNTER — Encounter (HOSPITAL_BASED_OUTPATIENT_CLINIC_OR_DEPARTMENT_OTHER): Payer: Self-pay | Admitting: *Deleted

## 2016-01-27 ENCOUNTER — Other Ambulatory Visit: Payer: Self-pay | Admitting: Orthopedic Surgery

## 2016-01-31 ENCOUNTER — Encounter (HOSPITAL_BASED_OUTPATIENT_CLINIC_OR_DEPARTMENT_OTHER): Payer: Self-pay | Admitting: Anesthesiology

## 2016-01-31 ENCOUNTER — Ambulatory Visit (HOSPITAL_BASED_OUTPATIENT_CLINIC_OR_DEPARTMENT_OTHER): Payer: 59 | Admitting: Anesthesiology

## 2016-01-31 ENCOUNTER — Ambulatory Visit (HOSPITAL_BASED_OUTPATIENT_CLINIC_OR_DEPARTMENT_OTHER)
Admission: RE | Admit: 2016-01-31 | Discharge: 2016-01-31 | Disposition: A | Discharge status: Home / Self Care | Payer: 59 | Source: Ambulatory Visit | Attending: Orthopedic Surgery | Admitting: Orthopedic Surgery

## 2016-01-31 ENCOUNTER — Encounter (HOSPITAL_BASED_OUTPATIENT_CLINIC_OR_DEPARTMENT_OTHER)
Admission: RE | Disposition: A | Discharge status: Home / Self Care | Payer: Self-pay | Source: Ambulatory Visit | Attending: Orthopedic Surgery

## 2016-01-31 DIAGNOSIS — M1990 Unspecified osteoarthritis, unspecified site: Secondary | ICD-10-CM | POA: Diagnosis not present

## 2016-01-31 DIAGNOSIS — I10 Essential (primary) hypertension: Secondary | ICD-10-CM | POA: Insufficient documentation

## 2016-01-31 DIAGNOSIS — G5602 Carpal tunnel syndrome, left upper limb: Secondary | ICD-10-CM | POA: Diagnosis present

## 2016-01-31 DIAGNOSIS — J45909 Unspecified asthma, uncomplicated: Secondary | ICD-10-CM | POA: Insufficient documentation

## 2016-01-31 DIAGNOSIS — E119 Type 2 diabetes mellitus without complications: Secondary | ICD-10-CM | POA: Diagnosis not present

## 2016-01-31 DIAGNOSIS — K219 Gastro-esophageal reflux disease without esophagitis: Secondary | ICD-10-CM | POA: Diagnosis not present

## 2016-01-31 HISTORY — PX: CARPAL TUNNEL RELEASE: SHX101

## 2016-01-31 LAB — POCT I-STAT, CHEM 8
BUN: 12 mg/dL (ref 6–20)
CALCIUM ION: 1.19 mmol/L (ref 1.15–1.40)
CREATININE: 0.8 mg/dL (ref 0.61–1.24)
Chloride: 103 mmol/L (ref 101–111)
Glucose, Bld: 166 mg/dL — ABNORMAL HIGH (ref 65–99)
HCT: 40 % (ref 39.0–52.0)
Hemoglobin: 13.6 g/dL (ref 13.0–17.0)
Potassium: 3.9 mmol/L (ref 3.5–5.1)
Sodium: 142 mmol/L (ref 135–145)
TCO2: 26 mmol/L (ref 0–100)

## 2016-01-31 LAB — GLUCOSE, CAPILLARY: Glucose-Capillary: 139 mg/dL — ABNORMAL HIGH (ref 65–99)

## 2016-01-31 SURGERY — CARPAL TUNNEL RELEASE
Anesthesia: Monitor Anesthesia Care | Site: Wrist | Laterality: Left

## 2016-01-31 MED ORDER — FENTANYL CITRATE (PF) 100 MCG/2ML IJ SOLN: 50.0000 ug | INTRAMUSCULAR | Status: DC | PRN

## 2016-01-31 MED ORDER — ONDANSETRON HCL 4 MG/2ML IJ SOLN
INTRAMUSCULAR | Status: DC | PRN
  Administered 2016-01-31: 4 mg via INTRAVENOUS

## 2016-01-31 MED ORDER — FENTANYL CITRATE (PF) 100 MCG/2ML IJ SOLN
INTRAMUSCULAR | Status: AC
  Filled 2016-01-31: qty 2

## 2016-01-31 MED ORDER — LACTATED RINGERS IV SOLN
INTRAVENOUS | Status: DC
  Administered 2016-01-31 (×2): via INTRAVENOUS

## 2016-01-31 MED ORDER — SODIUM CHLORIDE 0.9 % IJ SOLN
INTRAMUSCULAR | Status: AC
  Filled 2016-01-31: qty 10

## 2016-01-31 MED ORDER — FENTANYL CITRATE (PF) 100 MCG/2ML IJ SOLN
INTRAMUSCULAR | Status: DC | PRN
  Administered 2016-01-31: 100 ug via INTRAVENOUS

## 2016-01-31 MED ORDER — CEFAZOLIN SODIUM-DEXTROSE 2-4 GM/100ML-% IV SOLN
2.0000 g | INTRAVENOUS | Status: AC
  Administered 2016-01-31: 2 g via INTRAVENOUS

## 2016-01-31 MED ORDER — MIDAZOLAM HCL 2 MG/2ML IJ SOLN: 0.5000 mg | Freq: Once | INTRAMUSCULAR | Status: DC | PRN

## 2016-01-31 MED ORDER — MIDAZOLAM HCL 5 MG/5ML IJ SOLN
INTRAMUSCULAR | Status: DC | PRN
  Administered 2016-01-31: 2 mg via INTRAVENOUS

## 2016-01-31 MED ORDER — MIDAZOLAM HCL 2 MG/2ML IJ SOLN
INTRAMUSCULAR | Status: AC
Start: 2016-01-31 — End: 2016-01-31
  Filled 2016-01-31: qty 2

## 2016-01-31 MED ORDER — CEFAZOLIN SODIUM 1 G IJ SOLR
INTRAMUSCULAR | Status: AC
  Filled 2016-01-31: qty 20

## 2016-01-31 MED ORDER — CHLORHEXIDINE GLUCONATE 4 % EX LIQD: 60.0000 mL | Freq: Once | CUTANEOUS | Status: DC

## 2016-01-31 MED ORDER — FENTANYL CITRATE (PF) 100 MCG/2ML IJ SOLN: 25.0000 ug | INTRAMUSCULAR | Status: DC | PRN

## 2016-01-31 MED ORDER — PROPOFOL 500 MG/50ML IV EMUL
INTRAVENOUS | Status: DC | PRN
  Administered 2016-01-31: 25 ug/kg/min via INTRAVENOUS

## 2016-01-31 MED ORDER — MIDAZOLAM HCL 2 MG/2ML IJ SOLN: 1.0000 mg | INTRAMUSCULAR | Status: DC | PRN

## 2016-01-31 MED ORDER — LIDOCAINE HCL (PF) 0.5 % IJ SOLN
INTRAMUSCULAR | Status: DC | PRN
  Administered 2016-01-31: 30 mL via INTRAVENOUS

## 2016-01-31 MED ORDER — SCOPOLAMINE 1 MG/3DAYS TD PT72: 1.0000 | MEDICATED_PATCH | Freq: Once | TRANSDERMAL | Status: DC | PRN

## 2016-01-31 MED ORDER — PROMETHAZINE HCL 25 MG/ML IJ SOLN: 6.2500 mg | INTRAMUSCULAR | Status: DC | PRN

## 2016-01-31 MED ORDER — MEPERIDINE HCL 25 MG/ML IJ SOLN: 6.2500 mg | INTRAMUSCULAR | Status: DC | PRN

## 2016-01-31 MED ORDER — BUPIVACAINE HCL (PF) 0.25 % IJ SOLN
INTRAMUSCULAR | Status: DC | PRN
  Administered 2016-01-31: 9 mL

## 2016-01-31 SURGICAL SUPPLY — 36 items
BLADE SURG 15 STRL LF DISP TIS (BLADE) ×1 IMPLANT
BLADE SURG 15 STRL SS (BLADE) ×3
BNDG CMPR 9X4 STRL LF SNTH (GAUZE/BANDAGES/DRESSINGS) ×1
BNDG COHESIVE 3X5 TAN STRL LF (GAUZE/BANDAGES/DRESSINGS) ×3 IMPLANT
BNDG ESMARK 4X9 LF (GAUZE/BANDAGES/DRESSINGS) ×2 IMPLANT
BNDG GAUZE ELAST 4 BULKY (GAUZE/BANDAGES/DRESSINGS) ×3 IMPLANT
CHLORAPREP W/TINT 26ML (MISCELLANEOUS) ×3 IMPLANT
CORDS BIPOLAR (ELECTRODE) ×3 IMPLANT
COVER BACK TABLE 60X90IN (DRAPES) ×3 IMPLANT
COVER MAYO STAND STRL (DRAPES) ×3 IMPLANT
CUFF TOURNIQUET SINGLE 18IN (TOURNIQUET CUFF) ×3 IMPLANT
DRAPE EXTREMITY T 121X128X90 (DRAPE) ×3 IMPLANT
DRAPE SURG 17X23 STRL (DRAPES) ×3 IMPLANT
DRSG PAD ABDOMINAL 8X10 ST (GAUZE/BANDAGES/DRESSINGS) ×3 IMPLANT
GAUZE SPONGE 4X4 12PLY STRL (GAUZE/BANDAGES/DRESSINGS) ×3 IMPLANT
GAUZE XEROFORM 1X8 LF (GAUZE/BANDAGES/DRESSINGS) ×3 IMPLANT
GLOVE BIOGEL PI IND STRL 7.0 (GLOVE) IMPLANT
GLOVE BIOGEL PI IND STRL 8.5 (GLOVE) ×1 IMPLANT
GLOVE BIOGEL PI INDICATOR 7.0 (GLOVE) ×4
GLOVE BIOGEL PI INDICATOR 8.5 (GLOVE) ×2
GLOVE ECLIPSE 6.5 STRL STRAW (GLOVE) ×2 IMPLANT
GLOVE SURG ORTHO 8.0 STRL STRW (GLOVE) ×3 IMPLANT
GOWN STRL REUS W/ TWL LRG LVL3 (GOWN DISPOSABLE) ×1 IMPLANT
GOWN STRL REUS W/TWL LRG LVL3 (GOWN DISPOSABLE) ×3
GOWN STRL REUS W/TWL XL LVL3 (GOWN DISPOSABLE) ×3 IMPLANT
NDL PRECISIONGLIDE 27X1.5 (NEEDLE) IMPLANT
NEEDLE PRECISIONGLIDE 27X1.5 (NEEDLE) ×3 IMPLANT
NS IRRIG 1000ML POUR BTL (IV SOLUTION) ×3 IMPLANT
PACK BASIN DAY SURGERY FS (CUSTOM PROCEDURE TRAY) ×3 IMPLANT
STOCKINETTE 4X48 STRL (DRAPES) ×3 IMPLANT
SUT ETHILON 4 0 PS 2 18 (SUTURE) ×3 IMPLANT
SUT VICRYL 4-0 PS2 18IN ABS (SUTURE) IMPLANT
SYR BULB 3OZ (MISCELLANEOUS) ×3 IMPLANT
SYR CONTROL 10ML LL (SYRINGE) ×2 IMPLANT
TOWEL OR 17X24 6PK STRL BLUE (TOWEL DISPOSABLE) ×3 IMPLANT
UNDERPAD 30X30 (UNDERPADS AND DIAPERS) ×1 IMPLANT

## 2016-01-31 NOTE — Transfer of Care (Signed)
Immediate Anesthesia Transfer of Care Note  Patient: Alan Butler  Procedure(s) Performed: Procedure(s) with comments: LEFT CARPAL TUNNEL RELEASE (Left) - IV REG/FAB  Patient Location: PACU  Anesthesia Type:Bier block  Level of Consciousness: awake and patient cooperative  Airway & Oxygen Therapy: Patient Spontanous Breathing and Patient connected to face mask oxygen  Post-op Assessment: Report given to RN and Post -op Vital signs reviewed and stable  Post vital signs: Reviewed and stable  Last Vitals:  Vitals:   01/31/16 0939  BP: (!) 143/86  Pulse: 84  Resp: 20  Temp: 36.7 C    Last Pain:  Vitals:   01/31/16 0939  TempSrc: Oral         Complications: No apparent anesthesia complications 
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
## 2016-01-31 NOTE — Op Note (Signed)
NAME:  Alan Butler, [name]              ACCOUNT NO.:  [id number]  MEDICAL RECORD NO.:  [id number]  LOCATION:                                 FACILITY:  PHYSICIAN:  [name] Akisha Sturgill, M.D.            DATE OF BIRTH:  DATE OF PROCEDURE:  01/31/2016 DATE OF DISCHARGE:                              OPERATIVE REPORT   PREOPERATIVE DIAGNOSIS:  Carpal tunnel syndrome, left hand.  POSTOPERATIVE DIAGNOSIS:  Carpal tunnel syndrome, left hand.  OPERATION:  Decompression left median nerve.  SURGEON:  [name] Ketara Cavness, MD.  ANESTHESIA:  Forearm-based IV regional with local infiltration.  [facility]  ANESTHESIOLOGIST:  Dr. Jean RosenthalJackson.  HISTORY:  The patient is a 61 year old male with a history of bilateral carpal tunnel syndrome.  He is admitted for release of his left median nerve at the wrist.  Pre, peri and postoperative course have been discussed along with risks and complications.  He is aware that there is no guarantee to the surgery, the possibility of infection; recurrence of injury to arteries, nerves, tendons; incomplete relief of symptoms and dystrophy.  In the preoperative area, the patient was seen, the extremity marked by both patient and surgeon, and antibiotic given.  PROCEDURE IN DETAIL:  The patient was brought to the operating room, where a forearm-based IV regional anesthetic was carried out without difficulty under the direction of the Anesthesia Department.  He was prepped using ChloraPrep in a supine position with the left arm free.  A 3-minute dry time was allowed, and a time-out taken, confirming the patient and procedure.  A longitudinal incision was made in the left palm.  This was carried down through subcutaneous tissue.  Bleeders were electrocauterized with bipolar.  The superficial palmar arch and flexor tendon of the ring and little finger were identified.  Retractors were placed protecting the median nerve radially and the ulnar nerve  ulnarly. Sharp dissection was then used to transect the flexor retinaculum on its ulnar border.  A right angle and Sewall retractor were placed between the skin and forearm fascia proximally.  Blunt scissors were used to dissect any tissue from the posterior aspect of the flexor retinaculum remaining and the forearm fascia.  Blunt scissors were then used to transect the forearm fascia for approximately 2 cm proximal to the wrist crease under direct vision.  Canal was explored.  An area of compression to the nerve was apparent with the motor branch entering into the muscle distally.  The wound was copiously irrigated with saline.  The skin was then closed with interrupted 4-0 nylon sutures.  Local infiltration with 0.25% bupivacaine without epinephrine was given, approximately 10 mL was used.  A sterile compressive dressing with the fingers free was applied. On deflation of the tourniquet, all fingers immediately pinked.  He was taken to the recovery room for observation in satisfactory condition.  He has enough pain medicine from his prior surgery so that none needs to be prescribed.  He will return to the office in 1 week.          ______________________________ [name] Raeden Schippers, M.D.     GK/MEDQ  D:  01/31/2016  T:  01/31/2016  Job:  [id number]

## 2016-01-31 NOTE — Transfer of Care (Signed)
Immediate Anesthesia Transfer of Care Note  Patient: Alan Butler  Procedure(s) Performed: Procedure(s) with comments: LEFT CARPAL TUNNEL RELEASE (Left) - IV REG/FAB  Patient Location: PACU  Anesthesia Type:Bier block  Level of Consciousness: awake and patient cooperative  Airway & Oxygen Therapy: Patient Spontanous Breathing and Patient connected to face mask oxygen  Post-op Assessment: Report given to RN and Post -op Vital signs reviewed and stable  Post vital signs: Reviewed and stable  Last Vitals:  Vitals:   01/31/16 0939  BP: (!) 143/86  Pulse: 84  Resp: 20  Temp: 36.7 C    Last Pain:  Vitals:   01/31/16 0939  TempSrc: Oral         Complications: No apparent anesthesia complications
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
## 2016-01-31 NOTE — Op Note (Signed)
Dictation Number [id number]

## 2016-01-31 NOTE — Anesthesia Postprocedure Evaluation (Signed)
Anesthesia Post Note  Patient: Alan Butler  Procedure(s) Performed: Procedure(s) (LRB): LEFT CARPAL TUNNEL RELEASE (Left)  Patient location during evaluation: PACU Anesthesia Type: MAC and Bier Block Level of consciousness: awake and alert, patient cooperative and oriented Pain management: pain level controlled Vital Signs Assessment: post-procedure vital signs reviewed and stable Respiratory status: spontaneous breathing, nonlabored ventilation and respiratory function stable Cardiovascular status: blood pressure returned to baseline and stable Postop Assessment: no signs of nausea or vomiting Anesthetic complications: no       Last Vitals:  Vitals:   01/31/16 1100 01/31/16 1121  BP: 137/81 (!) 142/82  Pulse: 66 62  Resp: 11 16  Temp:  36.6 C    Last Pain:  Vitals:   01/31/16 1121  TempSrc: Oral  PainSc: 0-No pain                 Atha Mcbain,E. Shrihan Putt

## 2016-01-31 NOTE — Discharge Instructions (Addendum)

## 2016-01-31 NOTE — Brief Op Note (Signed)
01/31/2016  10:37 AM  PATIENT:  Alan Butler  61 y.o. male  PRE-OPERATIVE DIAGNOSIS:  LEFT CARPAL TUNNEL SYNDROME  POST-OPERATIVE DIAGNOSIS:  LEFT CARPAL TUNNEL SYNDROME  PROCEDURE:  Procedure(s) with comments: LEFT CARPAL TUNNEL RELEASE (Left) - IV REG/FAB  SURGEON:  Surgeon(s) and Role:    * [name] Anderson Middlebrooks, MD - Primary  PHYSICIAN ASSISTANT:   ASSISTANTS: none   ANESTHESIA:   local and regional  EBL:  Total I/O In: 1000 [I.V.:1000] Out: 2 [Blood:2]  BLOOD ADMINISTERED:none  DRAINS: none   LOCAL MEDICATIONS USED:  BUPIVICAINE   SPECIMEN:  No Specimen  DISPOSITION OF SPECIMEN:  N/A  COUNTS:  YES  TOURNIQUET:   Total Tourniquet Time Documented: Forearm (Left) - 23 minutes Total: Forearm (Left) - 23 minutes   DICTATION: .Other Dictation: Dictation Number [id number]  PLAN OF CARE: Discharge to home after PACU  PATIENT DISPOSITION:  PACU - hemodynamically stable.

## 2016-02-01 ENCOUNTER — Encounter (HOSPITAL_BASED_OUTPATIENT_CLINIC_OR_DEPARTMENT_OTHER): Payer: Self-pay | Admitting: Orthopedic Surgery

## 2016-03-19 ENCOUNTER — Ambulatory Visit (INDEPENDENT_AMBULATORY_CARE_PROVIDER_SITE_OTHER): Payer: 59 | Admitting: Internal Medicine

## 2016-03-19 ENCOUNTER — Encounter: Payer: Self-pay | Admitting: Internal Medicine

## 2016-03-19 VITALS — BP 136/76 | HR 83 | Temp 97.8°F | Ht 69.0 in | Wt 202.5 lb

## 2016-03-19 DIAGNOSIS — J4520 Mild intermittent asthma, uncomplicated: Secondary | ICD-10-CM | POA: Diagnosis not present

## 2016-03-19 DIAGNOSIS — K219 Gastro-esophageal reflux disease without esophagitis: Secondary | ICD-10-CM | POA: Diagnosis not present

## 2016-03-19 DIAGNOSIS — M160 Bilateral primary osteoarthritis of hip: Secondary | ICD-10-CM

## 2016-03-19 DIAGNOSIS — E7800 Pure hypercholesterolemia, unspecified: Secondary | ICD-10-CM

## 2016-03-19 DIAGNOSIS — Z87442 Personal history of urinary calculi: Secondary | ICD-10-CM

## 2016-03-19 DIAGNOSIS — I10 Essential (primary) hypertension: Secondary | ICD-10-CM

## 2016-03-19 DIAGNOSIS — D508 Other iron deficiency anemias: Secondary | ICD-10-CM

## 2016-03-19 DIAGNOSIS — E119 Type 2 diabetes mellitus without complications: Secondary | ICD-10-CM

## 2016-03-19 MED ORDER — METFORMIN HCL 1000 MG PO TABS: 1000.0000 mg | ORAL_TABLET | Freq: Two times a day (BID) | ORAL | 2 refills | Status: DC

## 2016-03-19 MED ORDER — PANTOPRAZOLE SODIUM 40 MG PO TBEC: 40.0000 mg | DELAYED_RELEASE_TABLET | Freq: Two times a day (BID) | ORAL | 2 refills | Status: DC

## 2016-03-19 MED ORDER — SIMVASTATIN 40 MG PO TABS: 40.0000 mg | ORAL_TABLET | Freq: Every day | ORAL | 2 refills | Status: DC

## 2016-03-19 MED ORDER — FOSINOPRIL SODIUM 40 MG PO TABS: 40.0000 mg | ORAL_TABLET | Freq: Every morning | ORAL | 2 refills | Status: DC

## 2016-03-19 NOTE — Patient Instructions (Signed)
Fat and Cholesterol Restricted Diet Introduction Getting too much fat and cholesterol in your diet may cause health problems. Following this diet helps keep your fat and cholesterol at normal levels. This can keep you from getting sick. What types of fat should I choose?  Choose monosaturated and polyunsaturated fats. These are found in foods such as olive oil, canola oil, flaxseeds, walnuts, almonds, and seeds.  Eat more omega-3 fats. Good choices include salmon, mackerel, sardines, tuna, flaxseed oil, and ground flaxseeds.  Limit saturated fats. These are in animal products such as meats, butter, and cream. They can also be in plant products such as palm oil, palm kernel oil, and coconut oil.  Avoid foods with partially hydrogenated oils in them. These contain trans fats. Examples of foods that have trans fats are stick margarine, some tub margarines, cookies, crackers, and other baked goods. What general guidelines do I need to follow?  Check food labels. Look for the words "trans fat" and "saturated fat."  When preparing a meal:  Fill half of your plate with vegetables and green salads.  Fill one fourth of your plate with whole grains. Look for the word "whole" as the first word in the ingredient list.  Fill one fourth of your plate with lean protein foods.  Eat more foods that have fiber, like apples, carrots, beans, peas, and barley.  Eat more home-cooked foods. Eat less at restaurants and buffets.  Limit or avoid alcohol.  Limit foods high in starch and sugar.  Limit fried foods.  Cook foods without frying them. Baking, boiling, grilling, and broiling are all great options.  Lose weight if you are overweight. Losing even a small amount of weight can help your overall health. It can also help prevent diseases such as diabetes and heart disease. What foods can I eat? Grains  Whole grains, such as whole wheat or whole grain breads, crackers, cereals, and pasta. Unsweetened  oatmeal, bulgur, barley, quinoa, or brown rice. Corn or whole wheat flour tortillas. Vegetables  Fresh or frozen vegetables (raw, steamed, roasted, or grilled). Green salads. Fruits  All fresh, canned (in natural juice), or frozen fruits. Meat and Other Protein Products  Ground beef (85% or leaner), grass-fed beef, or beef trimmed of fat. Skinless chicken or turkey. Ground chicken or turkey. Pork trimmed of fat. All fish and seafood. Eggs. Dried beans, peas, or lentils. Unsalted nuts or seeds. Unsalted canned or dry beans. Dairy  Low-fat dairy products, such as skim or 1% milk, 2% or reduced-fat cheeses, low-fat ricotta or cottage cheese, or plain low-fat yogurt. Fats and Oils  Tub margarines without trans fats. Light or reduced-fat mayonnaise and salad dressings. Avocado. Olive, canola, sesame, or safflower oils. Natural peanut or almond butter (choose ones without added sugar and oil). The items listed above may not be a complete list of recommended foods or beverages. Contact your dietitian for more options.  What foods are not recommended? Grains  White bread. White pasta. White rice. Cornbread. Bagels, pastries, and croissants. Crackers that contain trans fat. Vegetables  White potatoes. Corn. Creamed or fried vegetables. Vegetables in a cheese sauce. Fruits  Dried fruits. Canned fruit in light or heavy syrup. Fruit juice. Meat and Other Protein Products  Fatty cuts of meat. Ribs, chicken wings, bacon, sausage, bologna, salami, chitterlings, fatback, hot dogs, bratwurst, and packaged luncheon meats. Liver and organ meats. Dairy  Whole or 2% milk, cream, half-and-half, and cream cheese. Whole milk cheeses. Whole-fat or sweetened yogurt. Full-fat cheeses. Nondairy creamers and   whipped toppings. Processed cheese, cheese spreads, or cheese curds. Sweets and Desserts  Corn syrup, sugars, honey, and molasses. Candy. Jam and jelly. Syrup. Sweetened cereals. Cookies, pies, cakes, donuts,  muffins, and ice cream. Fats and Oils  Butter, stick margarine, lard, shortening, ghee, or bacon fat. Coconut, palm kernel, or palm oils. Beverages  Alcohol. Sweetened drinks (such as sodas, lemonade, and fruit drinks or punches). The items listed above may not be a complete list of foods and beverages to avoid. Contact your dietitian for more information.  This information is not intended to replace advice given to you by your health care provider. Make sure you discuss any questions you have with your health care provider. Document Released: 07/17/2011 Document Revised: 09/22/2015 Document Reviewed: 04/16/2013  2017 Elsevier  

## 2016-03-19 NOTE — Assessment & Plan Note (Signed)
Currently not an issue Will monitor 

## 2016-03-19 NOTE — Assessment & Plan Note (Signed)
Will check iron panel and CBC at next visit Continue iron supplement for now

## 2016-03-19 NOTE — Assessment & Plan Note (Signed)
Continue Naproxen prn He will continue follow with Dr. Despina HickAlusio

## 2016-03-19 NOTE — Assessment & Plan Note (Signed)
Encouraged him to consume a low carb diet and exercise for weight loss Continue Metformin Advised him to make an appt for an eye exam Foot exam today He is due for pneumovax, will get at his next appt Flu shot UTD

## 2016-03-19 NOTE — Assessment & Plan Note (Signed)
He reports it is usually lower than this Will monitor for now  Fosinopril refilled today

## 2016-03-19 NOTE — Assessment & Plan Note (Addendum)
Encouraged him to consume a low fat diet Continue Zocor, refilled today Will check lipid profile at his next visit

## 2016-03-19 NOTE — Assessment & Plan Note (Signed)
I want to wean his Protonix but will discuss this at his next visit Medication refilled today Continue Mylanta prn

## 2016-03-19 NOTE — Progress Notes (Signed)
HPI  Pt presents to the clinic today to establish care and for management of the conditions listed below. He is transferring care from [facility].  Anemia: His last CBC showed a H/H of 10.8/33.5. He is taking an iron supplement. He reports the last time he had his labs checked was 11/2015.  Asthma: Mild, intermittent. Usually worse when the seasons change. He takes Zyrtec daily. He uses his Albuterol inhaler 1-2 x a month.  GERD: Has history of a stomach ulcer. Reflux triggered by stress. He takes Protonix daily as prescribed. He intermittently has breakthrough symptoms. He takes Mylanta as needed with good relief.  History of Kidney Stones: None in the last 10 years.  HTN: He takes Fosinopril daily as prescribed. His BP today is 136/76 . He reports he is very nervous today. ECG from 11/2015 reviewed.  Arthritis: Mainly in his left hip. He has had a total right hip replacement. He takes Naproxen as needed with good relief. He follows with Dr. Despina Hick.  HLD: His last LDL was last checked 11/2015. He takes Zocor as prescribed. He denies myalgias. He tries to consume a low fat diet.  DM 2: His last A1C was  <7%. His sugars range 140-202. He is taking Metformin as prescribed. His last eye exam was 1 year ago. He does not check his feet daily.  Flu: 09/2015 Tetanus: ? 2011 Pneumovax: > 5 years ago PSA Screening: unsure Colon Screening: 2009 Vision Screening: annually Dentist: biannually  Past Medical History:  Diagnosis Date  . Anemia   . Asthma    prn inhaler  . Carpal tunnel syndrome on right 11/2015  . Complication of anesthesia 2001   hard to wake up post-op after shoulder surgery  . Cubital tunnel syndrome, right 11/2015  . Family history of adverse reaction to anesthesia    pt's father had reaction to anesthetic that is no longer used, per pt.  Marland Kitchen GERD (gastroesophageal reflux disease)   . History of gastric ulcer   . History of kidney stones   . Hypertension    states  under control with med., has been on med. > 10 yr.  . Non-insulin dependent type 2 diabetes mellitus (HCC)   . Osteoarthritis of hip    right    Current Outpatient Prescriptions  Medication Sig Dispense Refill  . albuterol (PROVENTIL HFA;VENTOLIN HFA) 108 (90 BASE) MCG/ACT inhaler Inhale 1 puff into the lungs every 6 (six) hours as needed for wheezing or shortness of breath.    Marland Kitchen aspirin EC 81 MG tablet Take 81 mg by mouth daily.    . cetirizine (ZYRTEC) 10 MG tablet Take 10 mg by mouth daily.    . cholecalciferol (VITAMIN D) 1000 units tablet Take 2,000 Units by mouth 2 (two) times daily.    . ferrous sulfate (FERROUSUL) 325 (65 FE) MG tablet Take 325 mg by mouth daily with breakfast.    . fosinopril (MONOPRIL) 40 MG tablet Take 40 mg by mouth every morning.    Marland Kitchen HYDROcodone-acetaminophen (NORCO) 10-325 MG tablet Take 1 tablet by mouth every 6 (six) hours as needed. 30 tablet 0  . metFORMIN (GLUCOPHAGE) 1000 MG tablet Take 1,000 mg by mouth 2 (two) times daily with a meal.    . naproxen sodium (ANAPROX) 220 MG tablet Take 220 mg by mouth 2 (two) times daily with a meal.    . pantoprazole (PROTONIX) 40 MG tablet Take 40 mg by mouth 2 (two) times daily.     . simvastatin (  ZOCOR) 40 MG tablet Take 40 mg by mouth daily.     No current facility-administered medications for this visit.     No Known Allergies  Family History  Problem Relation Age of Onset  . Anesthesia problems Father     father had reaction to anesthetic agent that is no longer used, per pt.    Social History   Social History  . Marital status: Married    Spouse name: N/A  . Number of children: N/A  . Years of education: N/A   Occupational History  . Not on file.   Social History Main Topics  . Smoking status: Never Smoker  . Smokeless tobacco: Never Used  . Alcohol use No  . Drug use: No  . Sexual activity: Not on file   Other Topics Concern  . Not on file   Social History Narrative  . No narrative on  file    ROS:  Constitutional: Denies fever, malaise, fatigue, headache or abrupt weight changes.  HEENT: Denies eye pain, eye redness, ear pain, ringing in the ears, wax buildup, runny nose, nasal congestion, bloody nose, or sore throat. Respiratory: Denies difficulty breathing, shortness of breath, cough or sputum production.   Cardiovascular: Denies chest pain, chest tightness, palpitations or swelling in the hands or feet.  Gastrointestinal: Pt reports intermittent reflux. Denies abdominal pain, bloating, constipation, diarrhea or blood in the stool.  GU: Denies frequency, urgency, pain with urination, blood in urine, odor or discharge. Musculoskeletal: Pt reports intermittent joint pain. Denies decrease in range of motion, difficulty with gait, muscle pain or joint swelling.  Skin: Denies redness, rashes, lesions or ulcercations.  Neurological: Denies dizziness, difficulty with memory, difficulty with speech or problems with balance and coordination.  Psych: Denies anxiety, depression, SI/HI.  No other specific complaints in a complete review of systems (except as listed in HPI above).  PE:  BP 136/76   Pulse 83   Temp 97.8 F (36.6 C) (Oral)   Ht 5\' 9"  (1.753 m)   Wt 202 lb 8 oz (91.9 kg)   SpO2 97%   BMI 29.90 kg/m   Wt Readings from Last 3 Encounters:  03/19/16 202 lb 8 oz (91.9 kg)  01/31/16 194 lb 6.4 oz (88.2 kg)  12/13/15 191 lb 3.2 oz (86.7 kg)    General: Appears his stated age,  in NAD. HEENT: Head: normal shape and size; Eyes: sclera white, no icterus, conjunctiva pink, PERRLA and EOMs intact;  Cardiovascular: Normal rate and rhythm. S1,S2 noted.  No murmur, rubs or gallops noted. No JVD or BLE edema. No carotid bruits noted. Pulmonary/Chest: Normal effort and positive vesicular breath sounds. No respiratory distress. No wheezes, rales or ronchi noted.  Abdomen: Soft and nontender. Normal bowel sounds, no bruits noted. No distention or masses noted.   Musculoskeletal:  No difficulty with gait.  Neurological: Alert and oriented.  Psychiatric: Mood and affect normal. Behavior is normal. Judgment and thought content normal.    BMET    Component Value Date/Time   NA 142 01/31/2016 1001   K 3.9 01/31/2016 1001   CL 103 01/31/2016 1001   CO2 27 05/28/2014 0434   GLUCOSE 166 (H) 01/31/2016 1001   BUN 12 01/31/2016 1001   CREATININE 0.80 01/31/2016 1001   CALCIUM 9.0 05/28/2014 0434   GFRNONAA >90 05/28/2014 0434   GFRAA >90 05/28/2014 0434    Lipid Panel  No results found for: CHOL, TRIG, HDL, CHOLHDL, VLDL, LDLCALC  CBC    Component  Value Date/Time   WBC 13.0 (H) 05/28/2014 0434   RBC 3.50 (L) 05/28/2014 0434   HGB 13.6 01/31/2016 1001   HCT 40.0 01/31/2016 1001   PLT 148 (L) 05/28/2014 0434   MCV 95.7 05/28/2014 0434   MCH 30.9 05/28/2014 0434   MCHC 32.2 05/28/2014 0434   RDW 12.9 05/28/2014 0434    Hgb A1C No results found for: HGBA1C   Assessment and Plan:

## 2016-03-19 NOTE — Assessment & Plan Note (Signed)
Likely allergy component Advised him to continue Zyrtec daily and Albuterol prn

## 2016-05-31 DIAGNOSIS — M1612 Unilateral primary osteoarthritis, left hip: Secondary | ICD-10-CM | POA: Diagnosis not present

## 2016-05-31 DIAGNOSIS — M1611 Unilateral primary osteoarthritis, right hip: Secondary | ICD-10-CM | POA: Diagnosis not present

## 2016-05-31 DIAGNOSIS — M160 Bilateral primary osteoarthritis of hip: Secondary | ICD-10-CM | POA: Diagnosis not present

## 2016-06-17 ENCOUNTER — Other Ambulatory Visit: Payer: Self-pay | Admitting: Internal Medicine

## 2016-06-30 IMAGING — DX DG PORTABLE PELVIS
1 series · 1 of 1 positions shown · non-contrast
Comparison: MRI right hip 01/18/2006.

CLINICAL DATA: Status post right hip replacement today.

EXAM:
PORTABLE PELVIS 1-2 VIEWS

[AP]
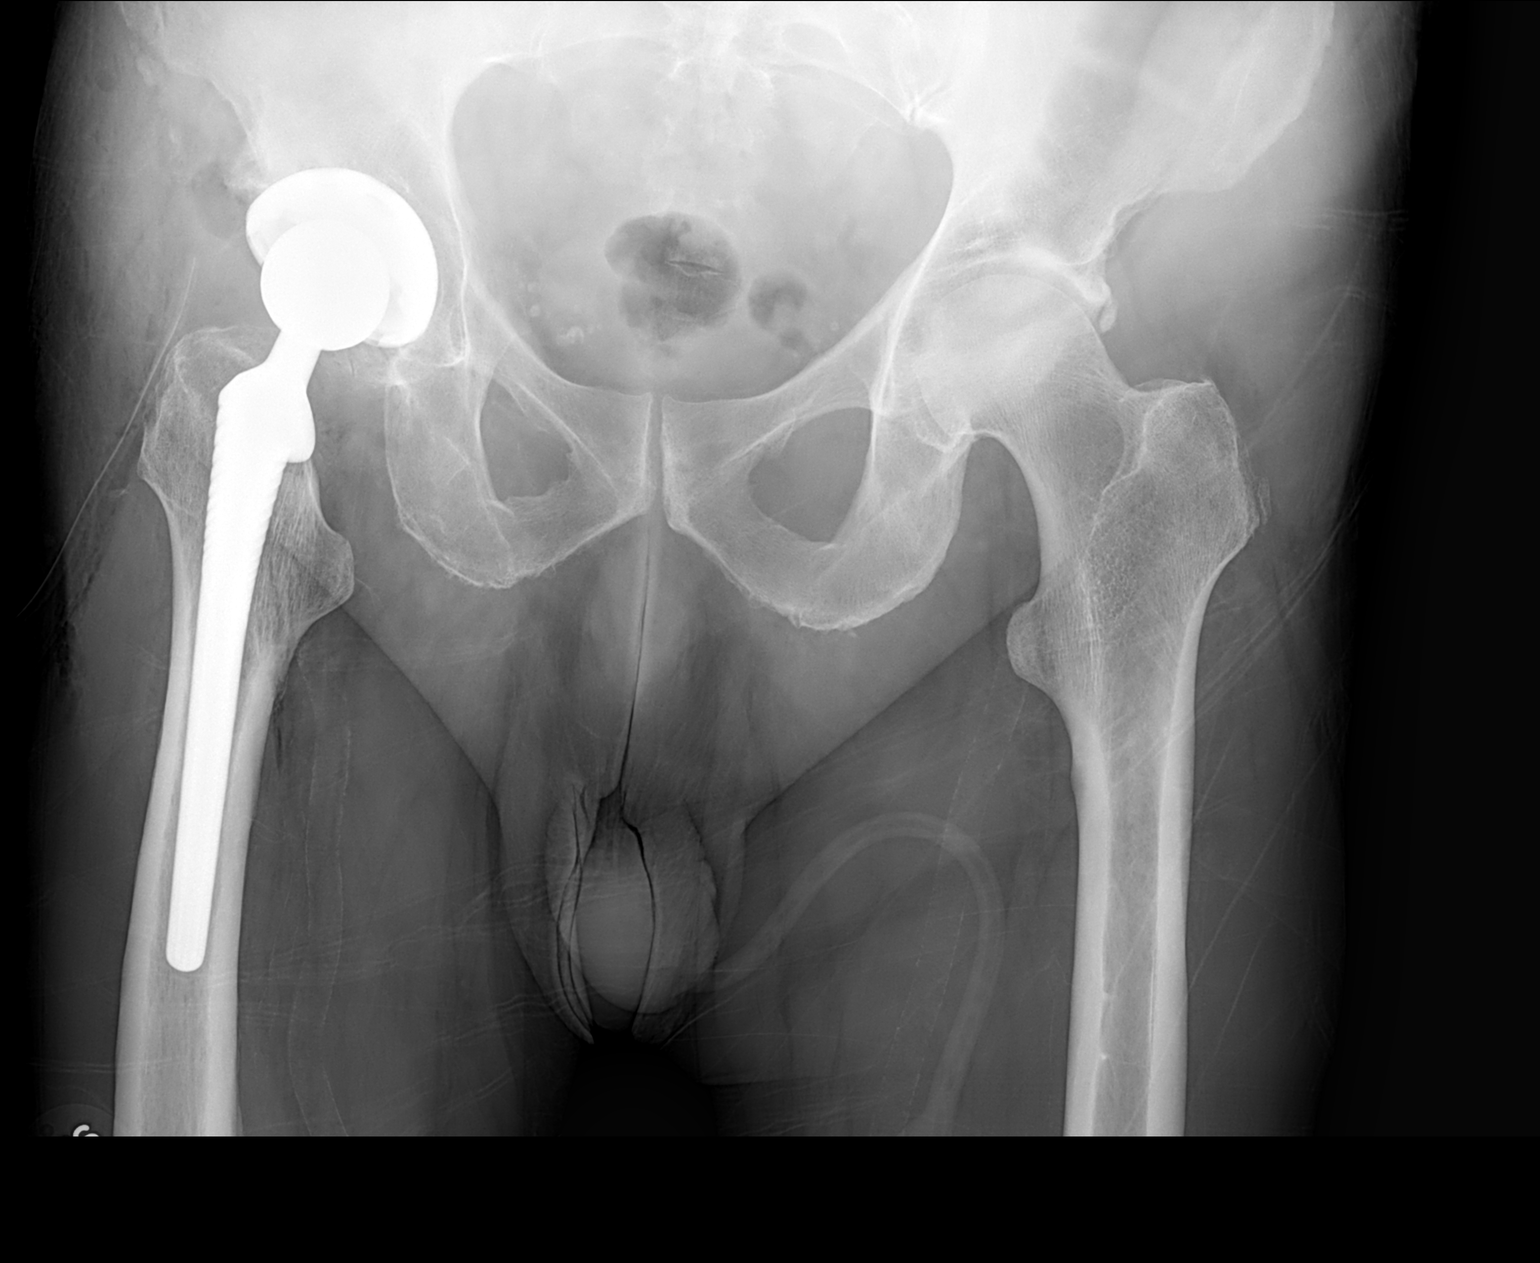

[1 of 1 positions shown; findings below may reference images not displayed]

FINDINGS: The patient has a new right total hip replacement. The device is
located and no fracture is identified. Gas in the soft tissues and
surgical drain are noted. Left hip degenerative disease is seen.
IMPRESSION: New right total hip replacement without evidence of complication.

## 2016-07-25 ENCOUNTER — Other Ambulatory Visit: Payer: Self-pay | Admitting: Internal Medicine

## 2016-08-14 ENCOUNTER — Other Ambulatory Visit: Payer: Self-pay | Admitting: Internal Medicine

## 2016-08-14 NOTE — Telephone Encounter (Signed)
CPE reminder letter mailed to pt  

## 2016-09-12 ENCOUNTER — Other Ambulatory Visit: Payer: Self-pay | Admitting: Internal Medicine

## 2016-09-19 ENCOUNTER — Other Ambulatory Visit: Payer: Self-pay | Admitting: Internal Medicine

## 2016-09-23 ENCOUNTER — Other Ambulatory Visit: Payer: Self-pay | Admitting: Internal Medicine

## 2016-10-10 ENCOUNTER — Other Ambulatory Visit: Payer: Self-pay | Admitting: Internal Medicine

## 2016-10-15 ENCOUNTER — Encounter: Payer: Self-pay | Admitting: Internal Medicine

## 2016-10-15 ENCOUNTER — Ambulatory Visit (INDEPENDENT_AMBULATORY_CARE_PROVIDER_SITE_OTHER): Payer: 59 | Admitting: Internal Medicine

## 2016-10-15 VITALS — BP 130/84 | HR 83 | Temp 97.9°F | Ht 69.0 in | Wt 194.0 lb

## 2016-10-15 DIAGNOSIS — D508 Other iron deficiency anemias: Secondary | ICD-10-CM | POA: Diagnosis not present

## 2016-10-15 DIAGNOSIS — M160 Bilateral primary osteoarthritis of hip: Secondary | ICD-10-CM | POA: Diagnosis not present

## 2016-10-15 DIAGNOSIS — Z125 Encounter for screening for malignant neoplasm of prostate: Secondary | ICD-10-CM | POA: Diagnosis not present

## 2016-10-15 DIAGNOSIS — E119 Type 2 diabetes mellitus without complications: Secondary | ICD-10-CM | POA: Diagnosis not present

## 2016-10-15 DIAGNOSIS — K219 Gastro-esophageal reflux disease without esophagitis: Secondary | ICD-10-CM | POA: Diagnosis not present

## 2016-10-15 DIAGNOSIS — I10 Essential (primary) hypertension: Secondary | ICD-10-CM

## 2016-10-15 DIAGNOSIS — E7800 Pure hypercholesterolemia, unspecified: Secondary | ICD-10-CM | POA: Diagnosis not present

## 2016-10-15 DIAGNOSIS — Z1159 Encounter for screening for other viral diseases: Secondary | ICD-10-CM

## 2016-10-15 DIAGNOSIS — Z114 Encounter for screening for human immunodeficiency virus [HIV]: Secondary | ICD-10-CM

## 2016-10-15 DIAGNOSIS — J4520 Mild intermittent asthma, uncomplicated: Secondary | ICD-10-CM

## 2016-10-15 DIAGNOSIS — D509 Iron deficiency anemia, unspecified: Secondary | ICD-10-CM

## 2016-10-15 DIAGNOSIS — Z0000 Encounter for general adult medical examination without abnormal findings: Secondary | ICD-10-CM

## 2016-10-15 MED ORDER — PANTOPRAZOLE SODIUM 40 MG PO TBEC
40.0000 mg | DELAYED_RELEASE_TABLET | Freq: Two times a day (BID) | ORAL | 11 refills | Status: DC
Start: 2016-10-15 — End: 2017-10-29

## 2016-10-15 MED ORDER — FOSINOPRIL SODIUM 40 MG PO TABS: 40.0000 mg | ORAL_TABLET | Freq: Every day | ORAL | 5 refills | Status: DC

## 2016-10-15 MED ORDER — ALBUTEROL SULFATE HFA 108 (90 BASE) MCG/ACT IN AERS: 1.0000 | INHALATION_SPRAY | Freq: Four times a day (QID) | RESPIRATORY_TRACT | 2 refills | Status: DC | PRN

## 2016-10-15 NOTE — Assessment & Plan Note (Signed)
Continue Zyrtec Albuterol refilled today 

## 2016-10-15 NOTE — Assessment & Plan Note (Signed)
A1C today No microalbumin secondary to ACEI therapy Encouraged him to consume a low carb diet Foot exam today Encouraged yearly eye exams He declines vaccines Continue Metformin for now, will adjust if needed based on labs

## 2016-10-15 NOTE — Assessment & Plan Note (Signed)
IBC panel today Continue iron panel for now

## 2016-10-15 NOTE — Assessment & Plan Note (Signed)
Continue Naproxen prn

## 2016-10-15 NOTE — Progress Notes (Signed)
Subjective:    Patient ID: Alan Butler, male    DOB: 1955/05/31, 61 y.o.   MRN: [id number]  HPI  Pt presents to the clinic today for his annual exam. He is also due to follow up chronic conditions.  History of Anemia: His last H/H was 10.8/33.5. He is taking an iron supplement daily. He denies s/s of bleeding.  Asthma: Mild, intermittent. Worse during the season changes. He takes Zyrtec daily and uses Albuterol as needed, about 1-2 x per month.  GERD: Hx of stomach ulcer. Triggered by stress. He takes Protonix daily as prescribed. He takes Mylanta as needed for breakthrough symptoms.  HLD: He is taking Simvastatin as prescribed. He denies myalgias. He tries to consume a low fat diet.   HTN: His BP today is 130/84. He is taking Fosinopril daily as prescribed. ECG from 11/2015 reviewed.  Arthritis: Mainly in his left hip. He takes Naproxen as needed with good relief. He follows with Dr. Despina Hick.  DM 2: His last A1C was < 7%. His sugars range 130-180. He is taking Metformin as prescribed. He does not routinely check his feet. His last eye exam was.  Flu: 09/2015 Tetanus: ? 2011 Pneumovax: > 5 years ago Zostovax: never Shingrix: never PSA Screening: unsure Colon Screening: 2009 Vision Screening: annually Dentist: biannually  Diet: He does eat meat. He consumes fruits and veggies. He tries to avoid foods. He drinks mostly water, some diet soda. Exercise: None  Review of Systems      Past Medical History:  Diagnosis Date  . Anemia   . Asthma    prn inhaler  . Carpal tunnel syndrome on right 11/2015  . Complication of anesthesia 2001   hard to wake up post-op after shoulder surgery  . Cubital tunnel syndrome, right 11/2015  . Family history of adverse reaction to anesthesia    pt's father had reaction to anesthetic that is no longer used, per pt.  Marland Kitchen GERD (gastroesophageal reflux disease)   . History of chicken pox   . History of gastric ulcer   . History of kidney  stones   . Hypertension    states under control with med., has been on med. > 10 yr.  . Non-insulin dependent type 2 diabetes mellitus (HCC)   . Osteoarthritis of hip    right    Current Outpatient Prescriptions  Medication Sig Dispense Refill  . albuterol (PROVENTIL HFA;VENTOLIN HFA) 108 (90 BASE) MCG/ACT inhaler Inhale 1 puff into the lungs every 6 (six) hours as needed for wheezing or shortness of breath.    Marland Kitchen aspirin EC 81 MG tablet Take 81 mg by mouth daily.    . cetirizine (ZYRTEC) 10 MG tablet Take 10 mg by mouth daily.    . cholecalciferol (VITAMIN D) 1000 units tablet Take 2,000 Units by mouth 2 (two) times daily.    . Clobetasol Prop Emollient Base (CLOBETASOL PROPIONATE E) 0.05 % emollient cream Apply 1 application topically as needed.    . Diclofenac Sodium 2 % SOLN Place 1 application onto the skin as needed.    . ferrous sulfate (FERROUSUL) 325 (65 FE) MG tablet Take 325 mg by mouth daily with breakfast.    . fosinopril (MONOPRIL) 40 MG tablet Take 1 tablet (40 mg total) by mouth daily. 30 tablet 0  . glucose blood (ONETOUCH VERIO) test strip 1 each by Other route as needed for other. Use as instructed    . metFORMIN (GLUCOPHAGE) 1000 MG tablet TAKE 1 TABLET  BY MOUTH TWICE A DAY 60 tablet 0  . naproxen sodium (ANAPROX) 220 MG tablet Take 220 mg by mouth 2 (two) times daily with a meal.    . pantoprazole (PROTONIX) 40 MG tablet Take 1 tablet (40 mg total) by mouth 2 (two) times daily. 60 tablet 2  . simvastatin (ZOCOR) 40 MG tablet TAKE 1 TABLET BY MOUTH AT BEDTIME 30 tablet 0   No current facility-administered medications for this visit.     No Known Allergies  Family History  Problem Relation Age of Onset  . Anesthesia problems Father        father had reaction to anesthetic agent that is no longer used, per pt.  . Diabetes Mother   . Diabetes Maternal Grandmother   . Diabetes Maternal Grandfather     Social History   Social History  . Marital status: Married      Spouse name: N/A  . Number of children: N/A  . Years of education: N/A   Occupational History  . Not on file.   Social History Main Topics  . Smoking status: Never Smoker  . Smokeless tobacco: Never Used  . Alcohol use No  . Drug use: No  . Sexual activity: Yes   Other Topics Concern  . Not on file   Social History Narrative  . No narrative on file     Constitutional: Denies fever, malaise, fatigue, headache or abrupt weight changes.  HEENT: Denies eye pain, eye redness, ear pain, ringing in the ears, wax buildup, runny nose, nasal congestion, bloody nose, or sore throat. Respiratory: Denies difficulty breathing, shortness of breath, cough or sputum production.   Cardiovascular: Denies chest pain, chest tightness, palpitations or swelling in the hands or feet.  Gastrointestinal: Denies abdominal pain, bloating, constipation, diarrhea or blood in the stool.  GU: Denies urgency, frequency, pain with urination, burning sensation, blood in urine, odor or discharge. Musculoskeletal: Denies decrease in range of motion, difficulty with gait, muscle pain or joint pain and swelling.  Skin: Denies redness, rashes, lesions or ulcercations.  Neurological: Denies dizziness, difficulty with memory, difficulty with speech or problems with balance and coordination.  Psych: Denies anxiety, depression, SI/HI.  No other specific complaints in a complete review of systems (except as listed in HPI above).  Objective:   Physical Exam   BP 130/84   Pulse 83   Temp 97.9 F (36.6 C) (Oral)   Ht  (1.753 m)   Wt 194 lb (88 kg)   SpO2 96%   BMI 28.65 kg/m  Wt Readings from Last 3 Encounters:  10/15/16 194 lb (88 kg)  03/19/16 202 lb 8 oz (91.9 kg)  01/31/16 194 lb 6.4 oz (88.2 kg)    General: Appears his stated age, well developed, well nourished in NAD. Skin: Warm, dry and intact.  HEENT: Head: normal shape and size; Eyes: sclera white, no icterus, conjunctiva pink, PERRLA and  EOMs intact; Ears: Tm's gray and intact, normal light reflex;Throat/Mouth: Teeth present, mucosa pink and moist, no exudate, lesions or ulcerations noted.  Neck:  Neck supple, trachea midline. No masses, lumps or thyromegaly present.  Cardiovascular: Normal rate and rhythm. S1,S2 noted.  No murmur, rubs or gallops noted. No JVD or BLE edema. No carotid bruits noted. Pulmonary/Chest: Normal effort and positive vesicular breath sounds. No respiratory distress. No wheezes, rales or ronchi noted.  Abdomen: Soft and nontender. Normal bowel sounds. No distention or masses noted. Liver, spleen and kidneys non palpable. Musculoskeletal: Strength 5/5 BUE/BLE.  No difficulty with gait.  Neurological: Alert and oriented. Cranial nerves II-XII grossly intact. Coordination normal.  Psychiatric: Mood and affect normal. Behavior is normal. Judgment and thought content normal.    BMET    Component Value Date/Time   NA 142 01/31/2016 1001   K 3.9 01/31/2016 1001   CL 103 01/31/2016 1001   CO2 27 05/28/2014 0434   GLUCOSE 166 (H) 01/31/2016 1001   BUN 12 01/31/2016 1001   CREATININE 0.80 01/31/2016 1001   CALCIUM 9.0 05/28/2014 0434   GFRNONAA >90 05/28/2014 0434   GFRAA >90 05/28/2014 0434    Lipid Panel  No results found for: CHOL, TRIG, HDL, CHOLHDL, VLDL, LDLCALC  CBC    Component Value Date/Time   WBC 13.0 (H) 05/28/2014 0434   RBC 3.50 (L) 05/28/2014 0434   HGB 13.6 01/31/2016 1001   HCT 40.0 01/31/2016 1001   PLT 148 (L) 05/28/2014 0434   MCV 95.7 05/28/2014 0434   MCH 30.9 05/28/2014 0434   MCHC 32.2 05/28/2014 0434   RDW 12.9 05/28/2014 0434    Hgb A1C No results found for: HGBA1C         Assessment & Plan:   Preventative Health Maintenance:  Encouraged him to get a flu shot in the fall He thinks his tetanus booster is UTD He decline pneumovax, zostovax and shingrix today Colon screening UTD Encouraged him to consume a balanced diet and exercise regimen Advised him to  see an eye doctor and dentist annually Will check CBC, CMET, Lipid, A1C, PSA, IBC panel, HIV and Hep C today  RTC in 1 year, sooner if needed Nicki Reaper, NP

## 2016-10-15 NOTE — Assessment & Plan Note (Signed)
Continue Fosinopril CMET today Will monitor

## 2016-10-15 NOTE — Assessment & Plan Note (Signed)
CMET and lipid profile today Encouraged him to consume a low fat diet Continue Simvastatin for now 

## 2016-10-15 NOTE — Assessment & Plan Note (Signed)
Discussed trying to avoid triggers Continue Protonix for now

## 2016-10-15 NOTE — Patient Instructions (Signed)

## 2016-10-16 LAB — CBC
HCT: 42.8 % (ref 39.0–52.0)
Hemoglobin: 14.3 g/dL (ref 13.0–17.0)
MCHC: 33.5 g/dL (ref 30.0–36.0)
MCV: 98.9 fl (ref 78.0–100.0)
PLATELETS: 173 10*3/uL (ref 150.0–400.0)
RBC: 4.33 Mil/uL (ref 4.22–5.81)
RDW: 12.7 % (ref 11.5–15.5)
WBC: 9.3 10*3/uL (ref 4.0–10.5)

## 2016-10-16 LAB — LIPID PANEL
CHOL/HDL RATIO: 2
CHOLESTEROL: 112 mg/dL (ref 0–200)
HDL: 47.2 mg/dL (ref >39.00)
LDL Cholesterol: 51 mg/dL (ref 0–99)
NonHDL: 64.83
TRIGLYCERIDES: 71 mg/dL (ref 0.0–149.0)
VLDL: 14.2 mg/dL (ref 0.0–40.0)

## 2016-10-16 LAB — COMPREHENSIVE METABOLIC PANEL
ALT: 20 U/L (ref 0–53)
AST: 19 U/L (ref 0–37)
Albumin: 4.7 g/dL (ref 3.5–5.2)
Alkaline Phosphatase: 50 U/L (ref 39–117)
BUN: 14 mg/dL (ref 6–23)
CALCIUM: 10.3 mg/dL (ref 8.4–10.5)
CHLORIDE: 103 meq/L (ref 96–112)
CO2: 29 mEq/L (ref 19–32)
CREATININE: 0.94 mg/dL (ref 0.40–1.50)
GFR: 86.93 mL/min (ref >60.00)
Glucose, Bld: 107 mg/dL — ABNORMAL HIGH (ref 70–99)
Potassium: 3.9 mEq/L (ref 3.5–5.1)
Sodium: 142 mEq/L (ref 135–145)
Total Bilirubin: 0.7 mg/dL (ref 0.2–1.2)
Total Protein: 7.3 g/dL (ref 6.0–8.3)

## 2016-10-16 LAB — HIV ANTIBODY (ROUTINE TESTING W REFLEX): HIV 1&2 Ab, 4th Generation: NONREACTIVE

## 2016-10-16 LAB — IBC PANEL
Iron: 86 ug/dL (ref 42–165)
SATURATION RATIOS: 21.3 % (ref 20.0–50.0)
TRANSFERRIN: 288 mg/dL (ref 212.0–360.0)

## 2016-10-16 LAB — PSA: PSA: 1.88 ng/mL (ref 0.10–4.00)

## 2016-10-16 LAB — HEPATITIS C ANTIBODY
HEP C AB: NONREACTIVE
SIGNAL TO CUT-OFF: 0.01 (ref <1.00)

## 2016-10-16 LAB — HEMOGLOBIN A1C: Hgb A1c MFr Bld: 7 % — ABNORMAL HIGH (ref 4.6–6.5)

## 2016-11-12 ENCOUNTER — Other Ambulatory Visit: Payer: Self-pay | Admitting: Internal Medicine

## 2016-11-15 ENCOUNTER — Other Ambulatory Visit: Payer: Self-pay | Admitting: Internal Medicine

## 2016-11-22 DIAGNOSIS — Z23 Encounter for immunization: Secondary | ICD-10-CM | POA: Diagnosis not present

## 2016-12-04 ENCOUNTER — Telehealth: Payer: Self-pay

## 2016-12-04 DIAGNOSIS — Z23 Encounter for immunization: Secondary | ICD-10-CM

## 2016-12-04 NOTE — Telephone Encounter (Signed)
Pt would like Rx for pneumovax sent to [facility]... Please advise

## 2016-12-05 MED ORDER — PNEUMOCOCCAL VAC POLYVALENT 25 MCG/0.5ML IJ INJ: 0.5000 mL | INJECTION | Freq: Once | INTRAMUSCULAR | Status: DC

## 2016-12-05 MED ORDER — PNEUMOCOCCAL VAC POLYVALENT 25 MCG/0.5ML IJ INJ: 0.5000 mL | INJECTION | Freq: Once | INTRAMUSCULAR | 0 refills | Status: AC

## 2016-12-05 NOTE — Addendum Note (Signed)
Addended by: Roena MaladyEVONTENNO, MELANIE Y on: 12/05/2016 12:23 PM   Modules accepted: Orders

## 2016-12-05 NOTE — Telephone Encounter (Signed)
Medication sent to pharmacy per request 

## 2016-12-06 DIAGNOSIS — Z23 Encounter for immunization: Secondary | ICD-10-CM | POA: Diagnosis not present

## 2016-12-21 ENCOUNTER — Other Ambulatory Visit: Payer: Self-pay | Admitting: Internal Medicine

## 2017-04-02 ENCOUNTER — Other Ambulatory Visit: Payer: Self-pay | Admitting: Internal Medicine

## 2017-06-16 ENCOUNTER — Other Ambulatory Visit: Payer: Self-pay | Admitting: Internal Medicine

## 2017-06-27 DIAGNOSIS — M160 Bilateral primary osteoarthritis of hip: Secondary | ICD-10-CM | POA: Diagnosis not present

## 2017-06-27 DIAGNOSIS — M25552 Pain in left hip: Secondary | ICD-10-CM | POA: Diagnosis not present

## 2017-06-27 DIAGNOSIS — M1611 Unilateral primary osteoarthritis, right hip: Secondary | ICD-10-CM | POA: Diagnosis not present

## 2017-06-27 DIAGNOSIS — M1612 Unilateral primary osteoarthritis, left hip: Secondary | ICD-10-CM | POA: Diagnosis not present

## 2017-08-07 ENCOUNTER — Other Ambulatory Visit: Payer: Self-pay | Admitting: Internal Medicine

## 2017-08-29 ENCOUNTER — Other Ambulatory Visit: Payer: Self-pay | Admitting: Internal Medicine

## 2017-09-17 ENCOUNTER — Other Ambulatory Visit: Payer: Self-pay | Admitting: Internal Medicine

## 2017-10-01 ENCOUNTER — Other Ambulatory Visit: Payer: Self-pay | Admitting: Internal Medicine

## 2017-10-24 DIAGNOSIS — Z23 Encounter for immunization: Secondary | ICD-10-CM | POA: Diagnosis not present

## 2017-10-29 ENCOUNTER — Other Ambulatory Visit: Payer: Self-pay | Admitting: Internal Medicine

## 2017-11-23 ENCOUNTER — Other Ambulatory Visit: Payer: Self-pay | Admitting: Internal Medicine

## 2017-11-25 ENCOUNTER — Ambulatory Visit (INDEPENDENT_AMBULATORY_CARE_PROVIDER_SITE_OTHER): Payer: 59 | Admitting: Internal Medicine

## 2017-11-25 ENCOUNTER — Encounter (INDEPENDENT_AMBULATORY_CARE_PROVIDER_SITE_OTHER): Payer: Self-pay

## 2017-11-25 ENCOUNTER — Encounter: Payer: Self-pay | Admitting: Internal Medicine

## 2017-11-25 VITALS — BP 126/82 | HR 79 | Temp 98.3°F | Ht 69.0 in | Wt 191.0 lb

## 2017-11-25 DIAGNOSIS — E119 Type 2 diabetes mellitus without complications: Secondary | ICD-10-CM | POA: Diagnosis not present

## 2017-11-25 DIAGNOSIS — Z0000 Encounter for general adult medical examination without abnormal findings: Secondary | ICD-10-CM | POA: Diagnosis not present

## 2017-11-25 DIAGNOSIS — K219 Gastro-esophageal reflux disease without esophagitis: Secondary | ICD-10-CM

## 2017-11-25 DIAGNOSIS — Z125 Encounter for screening for malignant neoplasm of prostate: Secondary | ICD-10-CM

## 2017-11-25 DIAGNOSIS — E7800 Pure hypercholesterolemia, unspecified: Secondary | ICD-10-CM

## 2017-11-25 DIAGNOSIS — M160 Bilateral primary osteoarthritis of hip: Secondary | ICD-10-CM

## 2017-11-25 DIAGNOSIS — J4520 Mild intermittent asthma, uncomplicated: Secondary | ICD-10-CM

## 2017-11-25 DIAGNOSIS — I10 Essential (primary) hypertension: Secondary | ICD-10-CM

## 2017-11-25 DIAGNOSIS — D508 Other iron deficiency anemias: Secondary | ICD-10-CM

## 2017-11-25 MED ORDER — CLOBETASOL PROP EMOLLIENT BASE 0.05 % EX CREA: 1.0000 "application " | TOPICAL_CREAM | CUTANEOUS | 1 refills | Status: DC | PRN

## 2017-11-25 NOTE — Assessment & Plan Note (Signed)
CBC today Continue iron supplement Will monitor

## 2017-11-25 NOTE — Assessment & Plan Note (Signed)
Controlled on Fosinopril CBC and CMET today Reinforced DASH diet and exercise for weight loss

## 2017-11-25 NOTE — Patient Instructions (Signed)

## 2017-11-25 NOTE — Assessment & Plan Note (Signed)
Stable on Naproxen He will continue to follow with ortho

## 2017-11-25 NOTE — Assessment & Plan Note (Signed)
Continue daily Zyrtec and prn Albuterol

## 2017-11-25 NOTE — Assessment & Plan Note (Signed)
Stable on Pantoprazole CBC and CMET today 

## 2017-11-25 NOTE — Assessment & Plan Note (Signed)
A1C today No microalbumin secondary to ACEI therapy Encouraged him to consume a low carb diet and exercise for weight loss Continue Metformin Encouraged yearly eye exam Foot exam today Flu and pneumovax UTD

## 2017-11-25 NOTE — Assessment & Plan Note (Signed)
CMET and Lipid profile today Encouraged him to consume a low fat diet Continue Simvastatin for now 

## 2017-11-25 NOTE — Progress Notes (Signed)
Subjective:    Patient ID: Alan Butler, male    DOB: 15-Aug-1955, 62 y.o.   MRN: [id number]  HPI  Pt presents to the clinic today for he annual exam. He is also due to follow up chronic conditions.   Anemia: His last H/H was 14.3/42.8, 09/2016. He is is taking an iron supplement daily. He denies fatigue, cold intolerance, shortness of breath.  Asthma: Mild, intermittent. Worse during the season changes. He takes Zyrtec daily and Albuterol as needed. There are no PFT's on file.  GERD: Hx of stomach ulcer. Triggered by stress. He does have some breakthrough on Pantoprazole, but he is able to take Maalox with good relief. There is no upper GI on file.  HLD: His last LD was 51, 09/2016. He denies myalgias on Simvastatin. He tries to consume a low fat diet.  HTN: His BP today 126/82. He is taking Fosinopril daily as prescribed. ECG from 11/2015 reviewed.  Arthritis: Mainly in his left hip. He takes Naproxen as needed with good relief. He follows with Dr. Despina Hick.  DM 2: His last A1C was 7%, 09/2016. His sugars range 103-203. He is taking Metformin as prescribed. He does not routinely check his feet. His last eye exam was.  Flu: 09/2017 Tetanus: ? 2011 Pneumovax: 11/2016 Zostovax: never Shingrix: never PSA Screening: 09/2016 Colon Screening: 2009 Vision Screening: annually Dentist: biannually   Diet: He does eat meat. He consumes fruits and veggies daily. He tries to avoid fried foods. He drinks mostly water, Dt. [place]. Exercise: None  Review of Systems      Past Medical History:  Diagnosis Date  . Anemia   . Asthma    prn inhaler  . Carpal tunnel syndrome on right 11/2015  . Complication of anesthesia 2001   hard to wake up post-op after shoulder surgery  . Cubital tunnel syndrome, right 11/2015  . Family history of adverse reaction to anesthesia    pt's father had reaction to anesthetic that is no longer used, per pt.  Marland Kitchen GERD (gastroesophageal reflux disease)   .  History of chicken pox   . History of gastric ulcer   . History of kidney stones   . Hypertension    states under control with med., has been on med. > 10 yr.  . Non-insulin dependent type 2 diabetes mellitus (HCC)   . Osteoarthritis of hip    right    Current Outpatient Medications  Medication Sig Dispense Refill  . albuterol (PROVENTIL HFA;VENTOLIN HFA) 108 (90 Base) MCG/ACT inhaler Inhale 1 puff into the lungs every 6 (six) hours as needed for wheezing or shortness of breath. 1 Inhaler 2  . aspirin EC 81 MG tablet Take 81 mg by mouth daily.    . cetirizine (ZYRTEC) 10 MG tablet Take 10 mg by mouth daily.    . cholecalciferol (VITAMIN D) 1000 units tablet Take 2,000 Units by mouth 2 (two) times daily.    . Clobetasol Prop Emollient Base (CLOBETASOL PROPIONATE E) 0.05 % emollient cream Apply 1 application topically as needed.    . Diclofenac Sodium 2 % SOLN Place 1 application onto the skin as needed.    . fosinopril (MONOPRIL) 40 MG tablet Take 1 tablet (40 mg total) by mouth daily. 30 tablet 1  . glucose blood (ONETOUCH VERIO) test strip 1 each by Other route 2 (two) times daily as needed for other. 100 each 9  . metFORMIN (GLUCOPHAGE) 1000 MG tablet Take 1 tablet (1,000 mg total)  by mouth 2 (two) times daily with a meal. 60 tablet 1  . naproxen sodium (ANAPROX) 220 MG tablet Take 220 mg by mouth 2 (two) times daily with a meal.    . ONETOUCH DELICA LANCETS 33G MISC 1 each by Other route 2 (two) times daily as needed. 100 each 9  . pantoprazole (PROTONIX) 40 MG tablet TAKE 1 TABLET BY MOUTH TWICE A DAY 60 tablet 0  . simvastatin (ZOCOR) 40 MG tablet TAKE 1 TABLET BY MOUTH AT BEDTIME 30 tablet 0  . simvastatin (ZOCOR) 40 MG tablet Take 1 tablet (40 mg total) by mouth daily. MUST SCHEDULE ANNUAL EXAM FOR REFILLS 90 tablet 0   No current facility-administered medications for this visit.     No Known Allergies  Family History  Problem Relation Age of Onset  . Anesthesia problems  Father        father had reaction to anesthetic agent that is no longer used, per pt.  . Diabetes Mother   . Diabetes Maternal Grandmother   . Diabetes Maternal Grandfather     Social History   Socioeconomic History  . Marital status: Married    Spouse name: Not on file  . Number of children: Not on file  . Years of education: Not on file  . Highest education level: Not on file  Occupational History  . Not on file  Social Needs  . Financial resource strain: Not on file  . Food insecurity:    Worry: Not on file    Inability: Not on file  . Transportation needs:    Medical: Not on file    Non-medical: Not on file  Tobacco Use  . Smoking status: Never Smoker  . Smokeless tobacco: Never Used  Substance and Sexual Activity  . Alcohol use: No  . Drug use: No  . Sexual activity: Yes  Lifestyle  . Physical activity:    Days per week: Not on file    Minutes per session: Not on file  . Stress: Not on file  Relationships  . Social connections:    Talks on phone: Not on file    Gets together: Not on file    Attends religious service: Not on file    Active member of club or organization: Not on file    Attends meetings of clubs or organizations: Not on file    Relationship status: Not on file  . Intimate partner violence:    Fear of current or ex partner: Not on file    Emotionally abused: Not on file    Physically abused: Not on file    Forced sexual activity: Not on file  Other Topics Concern  . Not on file  Social History Narrative  . Not on file     Constitutional: Denies fever, malaise, fatigue, headache or abrupt weight changes.  HEENT: Denies eye pain, eye redness, ear pain, ringing in the ears, wax buildup, runny nose, nasal congestion, bloody nose, or sore throat. Respiratory: Denies difficulty breathing, shortness of breath, cough or sputum production.   Cardiovascular: Denies chest pain, chest tightness, palpitations or swelling in the hands or feet.    Gastrointestinal: Denies abdominal pain, bloating, constipation, diarrhea or blood in the stool.  GU: Denies urgency, frequency, pain with urination, burning sensation, blood in urine, odor or discharge. Musculoskeletal: Pt reports intermittent left hip pain. Denies decrease in range of motion, difficulty with gait, muscle pain or joint swelling.  Skin: Denies redness, rashes, lesions or ulcercations.  Neurological:  Denies dizziness, difficulty with memory, difficulty with speech or problems with balance and coordination.  Psych: Denies anxiety, depression, SI/HI.  No other specific complaints in a complete review of systems (except as listed in HPI above).  Objective:   Physical Exam BP 126/82   Pulse 79   Temp 98.3 F (36.8 C) (Oral)   Ht 5\' 9"  (1.753 m)   Wt 191 lb (86.6 kg)   SpO2 98%   BMI 28.21 kg/m  Wt Readings from Last 3 Encounters:  11/25/17 191 lb (86.6 kg)  10/15/16 194 lb (88 kg)  03/19/16 202 lb 8 oz (91.9 kg)    General: Appears his stated age, well developed, well nourished in NAD. Skin: Warm, dry and intact. No ulcerations noted. HEENT: Head: normal shape and size; Eyes: sclera white, no icterus, conjunctiva pink, PERRLA and EOMs intact; Ears: Tm's gray and intact, normal light reflex;  Throat/Mouth: Teeth present, mucosa pink and moist, no exudate, lesions or ulcerations noted.  Neck:  Neck supple, trachea midline. No masses, lumps or thyromegaly present.  Cardiovascular: Normal rate and rhythm. S1,S2 noted.  No murmur, rubs or gallops noted. No JVD or BLE edema. No carotid bruits noted. Pulmonary/Chest: Normal effort and positive vesicular breath sounds. No respiratory distress. No wheezes, rales or ronchi noted.  Abdomen: Soft and nontender. Normal bowel sounds. No distention or masses noted. Liver, spleen and kidneys non palpable. Musculoskeletal: Strength 5/5 BUE/BLE. No difficulty with gait.  Neurological: Alert and oriented. Cranial nerves II-XII grossly  intact. Coordination normal.  Psychiatric: Mood and affect normal. Behavior is normal. Judgment and thought content normal.     BMET    Component Value Date/Time   NA 142 10/15/2016 1617   K 3.9 10/15/2016 1617   CL 103 10/15/2016 1617   CO2 29 10/15/2016 1617   GLUCOSE 107 (H) 10/15/2016 1617   BUN 14 10/15/2016 1617   CREATININE 0.94 10/15/2016 1617   CALCIUM 10.3 10/15/2016 1617   GFRNONAA >90 05/28/2014 0434   GFRAA >90 05/28/2014 0434    Lipid Panel     Component Value Date/Time   CHOL 112 10/15/2016 1617   TRIG 71.0 10/15/2016 1617   HDL 47.20 10/15/2016 1617   CHOLHDL 2 10/15/2016 1617   VLDL 14.2 10/15/2016 1617   LDLCALC 51 10/15/2016 1617    CBC    Component Value Date/Time   WBC 9.3 10/15/2016 1617   RBC 4.33 10/15/2016 1617   HGB 14.3 10/15/2016 1617   HCT 42.8 10/15/2016 1617   PLT 173.0 10/15/2016 1617   MCV 98.9 10/15/2016 1617   MCH 30.9 05/28/2014 0434   MCHC 33.5 10/15/2016 1617   RDW 12.7 10/15/2016 1617    Hgb A1C Lab Results  Component Value Date   HGBA1C 7.0 (H) 10/15/2016             Assessment & Plan:   Preventative Health Maintenance:  Flu shot UTD Tetanus UTD Pneumovax UTD He declines referral for repeat colonoscopy at this time. Denies Cologuard Encouraged him to consume a balanced diet and exercise regimen Advised him to see an eye doctor and dentist annually Will check CBC, CMET, Lipid, A1C, PSA today  RTC in 1 year, sooner if needed Nicki Reaper, NP

## 2017-11-26 LAB — CBC
HCT: 41.5 % (ref 39.0–52.0)
Hemoglobin: 14 g/dL (ref 13.0–17.0)
MCHC: 33.8 g/dL (ref 30.0–36.0)
MCV: 97.8 fl (ref 78.0–100.0)
PLATELETS: 143 10*3/uL — AB (ref 150.0–400.0)
RBC: 4.25 Mil/uL (ref 4.22–5.81)
RDW: 12.8 % (ref 11.5–15.5)
WBC: 7.4 10*3/uL (ref 4.0–10.5)

## 2017-11-26 LAB — COMPREHENSIVE METABOLIC PANEL
ALBUMIN: 4.7 g/dL (ref 3.5–5.2)
ALT: 15 U/L (ref 0–53)
AST: 14 U/L (ref 0–37)
Alkaline Phosphatase: 46 U/L (ref 39–117)
BILIRUBIN TOTAL: 0.8 mg/dL (ref 0.2–1.2)
BUN: 14 mg/dL (ref 6–23)
CHLORIDE: 103 meq/L (ref 96–112)
CO2: 29 mEq/L (ref 19–32)
CREATININE: 1.04 mg/dL (ref 0.40–1.50)
Calcium: 9.9 mg/dL (ref 8.4–10.5)
GFR: 77.07 mL/min (ref >60.00)
Glucose, Bld: 105 mg/dL — ABNORMAL HIGH (ref 70–99)
Potassium: 4.1 mEq/L (ref 3.5–5.1)
SODIUM: 141 meq/L (ref 135–145)
TOTAL PROTEIN: 6.9 g/dL (ref 6.0–8.3)

## 2017-11-26 LAB — LIPID PANEL
CHOLESTEROL: 110 mg/dL (ref 0–200)
HDL: 38.9 mg/dL — ABNORMAL LOW (ref >39.00)
LDL CALC: 60 mg/dL (ref 0–99)
NonHDL: 70.87
Total CHOL/HDL Ratio: 3
Triglycerides: 56 mg/dL (ref 0.0–149.0)
VLDL: 11.2 mg/dL (ref 0.0–40.0)

## 2017-11-26 LAB — HEMOGLOBIN A1C: Hgb A1c MFr Bld: 6.9 % — ABNORMAL HIGH (ref 4.6–6.5)

## 2017-11-26 LAB — PSA: PSA: 1.89 ng/mL (ref 0.10–4.00)

## 2017-11-27 ENCOUNTER — Encounter: Payer: Self-pay | Admitting: *Deleted

## 2017-12-10 ENCOUNTER — Other Ambulatory Visit: Payer: Self-pay | Admitting: Internal Medicine

## 2017-12-24 ENCOUNTER — Other Ambulatory Visit: Payer: Self-pay | Admitting: Internal Medicine

## 2018-01-31 ENCOUNTER — Other Ambulatory Visit: Payer: Self-pay | Admitting: Internal Medicine

## 2018-02-21 ENCOUNTER — Other Ambulatory Visit: Payer: Self-pay | Admitting: Internal Medicine

## 2018-06-15 ENCOUNTER — Encounter (HOSPITAL_COMMUNITY): Payer: Self-pay | Admitting: Emergency Medicine

## 2018-06-15 ENCOUNTER — Other Ambulatory Visit: Payer: Self-pay

## 2018-06-15 ENCOUNTER — Emergency Department (HOSPITAL_COMMUNITY)
Admission: EM | Admit: 2018-06-15 | Discharge: 2018-06-15 | Disposition: A | Discharge status: Home / Self Care | Payer: 59 | Attending: Emergency Medicine | Admitting: Emergency Medicine

## 2018-06-15 DIAGNOSIS — Y9289 Other specified places as the place of occurrence of the external cause: Secondary | ICD-10-CM | POA: Insufficient documentation

## 2018-06-15 DIAGNOSIS — Y9389 Activity, other specified: Secondary | ICD-10-CM | POA: Diagnosis not present

## 2018-06-15 DIAGNOSIS — S61012A Laceration without foreign body of left thumb without damage to nail, initial encounter: Secondary | ICD-10-CM

## 2018-06-15 DIAGNOSIS — Z23 Encounter for immunization: Secondary | ICD-10-CM | POA: Diagnosis not present

## 2018-06-15 DIAGNOSIS — W25XXXA Contact with sharp glass, initial encounter: Secondary | ICD-10-CM | POA: Diagnosis not present

## 2018-06-15 DIAGNOSIS — Y998 Other external cause status: Secondary | ICD-10-CM | POA: Insufficient documentation

## 2018-06-15 MED ORDER — POVIDONE-IODINE 10 % EX SOLN
CUTANEOUS | Status: AC
  Administered 2018-06-15: 1
  Filled 2018-06-15: qty 30

## 2018-06-15 MED ORDER — LIDOCAINE HCL (PF) 2 % IJ SOLN
INTRAMUSCULAR | Status: AC
  Administered 2018-06-15: 22:00:00 1 mL
  Filled 2018-06-15: qty 10

## 2018-06-15 MED ORDER — TETANUS-DIPHTH-ACELL PERTUSSIS 5-2.5-18.5 LF-MCG/0.5 IM SUSP
0.5000 mL | Freq: Once | INTRAMUSCULAR | Status: AC
  Administered 2018-06-15: 22:00:00 0.5 mL via INTRAMUSCULAR
  Filled 2018-06-15: qty 0.5

## 2018-06-15 NOTE — Discharge Instructions (Addendum)
Suture removal in 8 days  °

## 2018-06-15 NOTE — ED Provider Notes (Signed)
[facility] EMERGENCY DEPARTMENT Provider Note   CSN: [id number] Arrival date & time: 06/15/18  2105    History   Chief Complaint Chief Complaint  Patient presents with  . Laceration    HPI Alan Butler is a 63 y.o. male.     The history is provided by the patient. No language interpreter was used.  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  Pt reports he hit a glass jar.  Pt complains of cuts to his left thumb.   Past Medical History:  Diagnosis Date  . Anemia   . Asthma    prn inhaler  . Carpal tunnel syndrome on right 11/2015  . Complication of anesthesia 2001   hard to wake up post-op after shoulder surgery  . Cubital tunnel syndrome, right 11/2015  . Family history of adverse reaction to anesthesia    pt's father had reaction to anesthetic that is no longer used, per pt.  Marland Kitchen GERD (gastroesophageal reflux disease)   . History of chicken pox   . History of gastric ulcer   . History of kidney stones   . Hypertension    states under control with med., has been on med. > 10 yr.  . Non-insulin dependent type 2 diabetes mellitus (HCC)   . Osteoarthritis of hip    right    Patient Active Problem List   Diagnosis Date Noted  . Iron deficiency anemia secondary to inadequate dietary iron intake 03/19/2016  . Mild intermittent asthma without complication 03/19/2016  . Gastroesophageal reflux disease without esophagitis 03/19/2016  . History of kidney stones 03/19/2016  . Essential hypertension 03/19/2016  . Primary osteoarthritis of both hips 03/19/2016  . Pure hypercholesterolemia 03/19/2016  . Type 2  diabetes mellitus without complication, without long-term current use of insulin (HCC) 03/19/2016    Past Surgical History:  Procedure Laterality Date  . CARPAL TUNNEL RELEASE Right 12/13/2015   Procedure: CARPAL TUNNEL RELEASE, right;  Surgeon: Cindee Salt, MD;  Location: [facility];  Service: Orthopedics;  Laterality: Right;  . CARPAL TUNNEL RELEASE Left 01/31/2016   Procedure: LEFT CARPAL TUNNEL RELEASE;  Surgeon: Cindee Salt, MD;  Location: [facility];  Service: Orthopedics;  Laterality: Left;  IV REG/FAB  . JOINT REPLACEMENT Right    hip  . SHOULDER ARTHROSCOPY Right 2001  . TOTAL HIP ARTHROPLASTY Right 05/26/2014   Procedure: RIGHT TOTAL HIP ARTHROPLASTY ANTERIOR APPROACH;  Surgeon: Ollen Gross, MD;  Location: [facility];  Service: Orthopedics;  Laterality: Right;  . ULNAR NERVE TRANSPOSITION Right 12/13/2015   Procedure: ULNAR NERVE DECOMPRESSION;  Surgeon: Cindee Salt, MD;  Location: [facility];  Service: Orthopedics;  Laterality: Right;        Home Medications    Prior to Admission medications   Medication Sig Start Date End Date Taking? Authorizing Provider  aspirin EC 81 MG tablet Take 81 mg by mouth daily.    [provider]  cetirizine (ZYRTEC) 10 MG tablet Take 10 mg by mouth daily.    [provider]  cholecalciferol (VITAMIN D) 1000 units tablet Take 2,000 Units by mouth 2 (  two) times daily.    [provider]  Clobetasol Prop Emollient Base (CLOBETASOL PROPIONATE E) 0.05 % emollient cream Apply 1 application topically as needed. 11/25/17   Lorre Munroe, NP  Diclofenac Sodium 2 % SOLN Place 1 application onto the skin as needed.    [provider]  fosinopril (MONOPRIL) 40 MG tablet TAKE 1 TABLET BY MOUTH EVERY DAY 11/27/17   Lorre Munroe, NP  glucose blood test strip USE TO TEST BLOOD SUGAR TWICE DAILY AS NEEDED 02/21/18   Lorre Munroe, NP  metFORMIN (GLUCOPHAGE) 1000 MG tablet TAKE 1  TABLET (1,000 MG TOTAL) BY MOUTH 2 (TWO) TIMES DAILY WITH A MEAL. 11/27/17   Lorre Munroe, NP  naproxen sodium (ANAPROX) 220 MG tablet Take 220 mg by mouth 2 (two) times daily with a meal.    [provider]  [facility] DELICA LANCETS 33G MISC TEST TWICE A DAY AS NEEDED 01/31/18   Lorre Munroe, NP  pantoprazole (PROTONIX) 40 MG tablet TAKE 1 TABLET BY MOUTH TWICE A DAY 11/27/17   Lorre Munroe, NP  simvastatin (ZOCOR) 40 MG tablet TAKE 1 TABLET BY MOUTH AT BEDTIME 09/24/16   Lorre Munroe, NP  simvastatin (ZOCOR) 40 MG tablet Take 1 tablet (40 mg total) by mouth daily. 12/11/17   Lorre Munroe, NP  VENTOLIN HFA 108 (90 Base) MCG/ACT inhaler INHALE 1 PUFF INTO THE LUNGS EVERY 6 (SIX) HOURS AS NEEDED FOR WHEEZING OR SHORTNESS OF BREATH. 12/25/17   Lorre Munroe, NP    Family History Family History  Problem Relation Age of Onset  . Anesthesia problems Father        father had reaction to anesthetic agent that is no longer used, per pt.  . Diabetes Mother   . Diabetes Maternal Grandmother   . Diabetes Maternal Grandfather     Social History Social History   Tobacco Use  . Smoking status: Never Smoker  . Smokeless tobacco: Never Used  Substance Use Topics  . Alcohol use: No  . Drug use: No     Allergies   Patient has no known allergies.   Review of Systems Review of Systems  Skin: Positive for wound.  All other systems reviewed and are negative.    Physical Exam Updated Vital Signs BP (!) 159/95 (BP Location: Left Arm)   Pulse 60   Temp 98.3 F (36.8 C) (Oral)   Resp 17   Ht 5\' 10"  (1.778 m)   Wt 88.5 kg   SpO2 100%   BMI 27.98 kg/m   Physical Exam Vitals signs reviewed.  Constitutional:      Appearance: Normal appearance.  Cardiovascular:     Rate and Rhythm: Normal rate.  Pulmonary:     Effort: Pulmonary effort is normal.  Musculoskeletal:        General: Signs of injury present.     Comments: 1 cm laceration and 1.5 cm laceration left  thumb, bleeding,  From nv and ns intact  Skin:    General: Skin is warm.  Neurological:     General: No focal deficit present.     Mental Status: He is alert.  Psychiatric:        Mood and Affect: Mood normal.      ED Treatments / Results  Labs (all labs ordered are listed, but only abnormal results are displayed) Labs Reviewed - No data to display  EKG None  Radiology No results found.  Procedures .Marland KitchenLaceration Repair  Date/Time: 06/15/2018 10:10 PM Performed by: Elson Areas, PA-C Authorized by: Elson Areas, PA-C   Consent:    Consent obtained:  Verbal   Consent given by:  Patient   Risks discussed:  Pain   Alternatives discussed:  No treatment Anesthesia (see MAR for exact dosages):    Anesthesia method:  Local infiltration   Local anesthetic:  Lidocaine 1% w/o epi Laceration details:    Location:  Finger   Finger location:  L thumb   Length (cm):  1.5   Depth (mm):  3 Repair type:    Repair type:  Simple Pre-procedure details:    Preparation:  Patient was prepped and draped in usual sterile fashion Exploration:    Wound exploration: wound explored through full range of motion and entire depth of wound probed and visualized     Contaminated: no   Treatment:    Area cleansed with:  Betadine   Irrigation solution:  Sterile saline   Visualized foreign bodies/material removed: no   Skin repair:    Repair method:  Sutures   Suture size:  5-0   Suture material:  Prolene   Suture technique:  Simple interrupted   Number of sutures:  3 Approximation:    Approximation:  Close Post-procedure details:    Patient tolerance of procedure:  Tolerated well, no immediate complications .Marland KitchenLaceration Repair Date/Time: 06/15/2018 10:12 PM Performed by: Elson Areas, PA-C Authorized by: Elson Areas, PA-C   Consent:    Consent obtained:  Verbal   Consent given by:  Patient   Risks discussed:  Infection   Alternatives discussed:  No treatment Anesthesia  (see MAR for exact dosages):    Anesthesia method:  Local infiltration Laceration details:    Location:  Finger   Finger location:  L thumb   Length (cm):  1   Depth (mm):  2 Repair type:    Repair type:  Simple Exploration:    Wound exploration: wound explored through full range of motion     Contaminated: no   Treatment:    Area cleansed with:  Betadine   Amount of cleaning:  Standard   Irrigation solution:  Sterile saline   Visualized foreign bodies/material removed: no   Skin repair:    Repair method:  Sutures   Suture size:  5-0   Suture technique:  Simple interrupted   Number of sutures:  2 Approximation:    Approximation:  Loose Post-procedure details:    Patient tolerance of procedure:  Tolerated well, no immediate complications   (including critical care time)  Medications Ordered in ED Medications  povidone-iodine (BETADINE) 10 % external solution (has no administration in time range)  lidocaine (XYLOCAINE) 2 % injection (has no administration in time range)     Initial Impression / Assessment and Plan / ED Course  I have reviewed the triage vital signs and the nursing notes.  Pertinent labs & imaging results that were available during my care of the patient were reviewed by me and considered in my medical decision making (see chart for details).        MDM  No foreign body visualized.  Pt advised of possible fb risk.  Pt advised suture removal in 8 days.    Final Clinical Impressions(s) / ED Diagnoses   Final diagnoses:  Laceration of left thumb without foreign body, nail damage status unspecified, initial encounter    ED Discharge Orders    None    An After Visit Summary was  printed and given to the patient.    Osie CheeksSofia, Lynnae Ludemann K, PA-C 06/15/18 2217    Linwood DibblesKnapp, Jon, MD 06/19/18 [phone]

## 2018-06-15 NOTE — ED Triage Notes (Signed)
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

## 2018-06-24 ENCOUNTER — Other Ambulatory Visit: Payer: Self-pay

## 2018-06-24 ENCOUNTER — Encounter: Payer: Self-pay | Admitting: Internal Medicine

## 2018-06-24 ENCOUNTER — Ambulatory Visit (INDEPENDENT_AMBULATORY_CARE_PROVIDER_SITE_OTHER): Payer: 59 | Admitting: Internal Medicine

## 2018-06-24 VITALS — BP 128/84 | HR 83 | Temp 98.3°F | Wt 191.0 lb

## 2018-06-24 DIAGNOSIS — I499 Cardiac arrhythmia, unspecified: Secondary | ICD-10-CM

## 2018-06-24 DIAGNOSIS — S61012D Laceration without foreign body of left thumb without damage to nail, subsequent encounter: Secondary | ICD-10-CM

## 2018-06-24 NOTE — Progress Notes (Deleted)
Virtual Visit via Video Note  I connected with Alan Butler on 06/24/18 at  2:00 PM EDT by a video enabled telemedicine application and verified that I am speaking with the correct person using two identifiers.  Location: Patient: *** Provider: ***   I discussed the limitations of evaluation and management by telemedicine and the availability of in person appointments. The patient expressed understanding and agreed to proceed.  History of Present Illness:    Observations/Objective:   Assessment and Plan:   Follow Up Instructions:    I discussed the assessment and treatment plan with the patient. The patient was provided an opportunity to ask questions and all were answered. The patient agreed with the plan and demonstrated an understanding of the instructions.   The patient was advised to call back or seek an in-person evaluation if the symptoms worsen or if the condition fails to improve as anticipated.  I provided *** minutes of non-face-to-face time during this encounter.   Nicki Reaper, NP

## 2018-06-25 ENCOUNTER — Encounter: Payer: Self-pay | Admitting: Internal Medicine

## 2018-06-25 NOTE — Progress Notes (Signed)
Subjective:    Patient ID: Alan Butler, male    DOB: 1955/08/19, 63 y.o.   MRN: [id number]  HPI  Pt due for ER follow up and suture removal. He went to the ER 5/17 after cutting his left thumb on a piece of glass. He received a Tdap vaccine and 5 sutures. He denies redness, swelling, pain or discharge.  Review of Systems      Past Medical History:  Diagnosis Date  . Anemia   . Asthma    prn inhaler  . Carpal tunnel syndrome on right 11/2015  . Complication of anesthesia 2001   hard to wake up post-op after shoulder surgery  . Cubital tunnel syndrome, right 11/2015  . Family history of adverse reaction to anesthesia    pt's father had reaction to anesthetic that is no longer used, per pt.  Marland Kitchen GERD (gastroesophageal reflux disease)   . History of chicken pox   . History of gastric ulcer   . History of kidney stones   . Hypertension    states under control with med., has been on med. > 10 yr.  . Non-insulin dependent type 2 diabetes mellitus ([facility])   . Osteoarthritis of hip    right    Current Outpatient Medications  Medication Sig Dispense Refill  . aspirin EC 81 MG tablet Take 81 mg by mouth daily.    . cetirizine (ZYRTEC) 10 MG tablet Take 10 mg by mouth daily.    . cholecalciferol (VITAMIN D) 1000 units tablet Take 2,000 Units by mouth 2 (two) times daily.    . Clobetasol Prop Emollient Base (CLOBETASOL PROPIONATE E) 0.05 % emollient cream Apply 1 application topically as needed. 30 g 1  . Diclofenac Sodium 2 % SOLN Place 1 application onto the skin as needed.    . fosinopril (MONOPRIL) 40 MG tablet TAKE 1 TABLET BY MOUTH EVERY DAY 30 tablet 10  . glucose blood test strip USE TO TEST BLOOD SUGAR TWICE DAILY AS NEEDED 50 each 5  . metFORMIN (GLUCOPHAGE) 1000 MG tablet TAKE 1 TABLET (1,000 MG TOTAL) BY MOUTH 2 (TWO) TIMES DAILY WITH A MEAL. 60 tablet 10  . naproxen sodium (ANAPROX) 220 MG tablet Take 220 mg by mouth 2 (two) times daily with a meal.    . ONETOUCH  DELICA LANCETS 33G MISC TEST TWICE A DAY AS NEEDED 100 each 9  . pantoprazole (PROTONIX) 40 MG tablet TAKE 1 TABLET BY MOUTH TWICE A DAY 60 tablet 10  . simvastatin (ZOCOR) 40 MG tablet TAKE 1 TABLET BY MOUTH AT BEDTIME 30 tablet 0  . simvastatin (ZOCOR) 40 MG tablet Take 1 tablet (40 mg total) by mouth daily. 90 tablet 2  . VENTOLIN HFA 108 (90 Base) MCG/ACT inhaler INHALE 1 PUFF INTO THE LUNGS EVERY 6 (SIX) HOURS AS NEEDED FOR WHEEZING OR SHORTNESS OF BREATH. 18 Inhaler 2   No current facility-administered medications for this visit.     No Known Allergies  Family History  Problem Relation Age of Onset  . Anesthesia problems Father        father had reaction to anesthetic agent that is no longer used, per pt.  . Diabetes Mother   . Diabetes Maternal Grandmother   . Diabetes Maternal Grandfather     Social History   Socioeconomic History  . Marital status: Married    Spouse name: Not on file  . Number of children: Not on file  . Years of education: Not on file  .  Highest education level: Not on file  Occupational History  . Not on file  Social Needs  . Financial resource strain: Not on file  . Food insecurity:    Worry: Not on file    Inability: Not on file  . Transportation needs:    Medical: Not on file    Non-medical: Not on file  Tobacco Use  . Smoking status: Never Smoker  . Smokeless tobacco: Never Used  Substance and Sexual Activity  . Alcohol use: No  . Drug use: No  . Sexual activity: Yes  Lifestyle  . Physical activity:    Days per week: Not on file    Minutes per session: Not on file  . Stress: Not on file  Relationships  . Social connections:    Talks on phone: Not on file    Gets together: Not on file    Attends religious service: Not on file    Active member of club or organization: Not on file    Attends meetings of clubs or organizations: Not on file    Relationship status: Not on file  . Intimate partner violence:    Fear of current or ex  partner: Not on file    Emotionally abused: Not on file    Physically abused: Not on file    Forced sexual activity: Not on file  Other Topics Concern  . Not on file  Social History Narrative  . Not on file     Constitutional: Denies fever, malaise, fatigue, headache or abrupt weight changes.  Respiratory: Denies difficulty breathing, shortness of breath, cough or sputum production.   Cardiovascular: Denies chest pain, chest tightness, palpitations or swelling in the hands or feet.  Musculoskeletal: Denies decrease in range of motion, difficulty with gait, muscle pain or joint pain and swelling.  Skin: Pt reports laceration to left thumb. Denies redness, rashes, lesions or ulcercations.    No other specific complaints in a complete review of systems (except as listed in HPI above).  Objective:   Physical Exam  BP 128/84   Pulse 83   Temp 98.3 F (36.8 C) (Oral)   Wt 191 lb (86.6 kg)   SpO2 98%   BMI 27.41 kg/m  Wt Readings from Last 3 Encounters:  06/24/18 191 lb (86.6 kg)  06/15/18 195 lb (88.5 kg)  11/25/17 191 lb (86.6 kg)    General: Appears his stated age, well developed, well nourished in NAD. Skin: Laceration, well healed to left thumb. Cardiovascular: Normal rate and rhythm with extra beats. S1,S2 noted.  No murmur, rubs or gallops noted.  Pulmonary/Chest: Normal effort. No respiratory distress.  Musculoskeletal: Normal flexion, extension of the left thumb. Neurological: Alert and oriented. Sensation intact to left thumb.  BMET    Component Value Date/Time   NA 141 11/25/2017 1456   K 4.1 11/25/2017 1456   CL 103 11/25/2017 1456   CO2 29 11/25/2017 1456   GLUCOSE 105 (H) 11/25/2017 1456   BUN 14 11/25/2017 1456   CREATININE 1.04 11/25/2017 1456   CALCIUM 9.9 11/25/2017 1456   GFRNONAA >90 05/28/2014 0434   GFRAA >90 05/28/2014 0434    Lipid Panel     Component Value Date/Time   CHOL 110 11/25/2017 1456   TRIG 56.0 11/25/2017 1456   HDL 38.90 (L)  11/25/2017 2482   CHOLHDL 3 11/25/2017 1456   VLDL 11.2 11/25/2017 1456   LDLCALC 60 11/25/2017 1456    CBC    Component Value Date/Time  WBC 7.4 11/25/2017 1456   RBC 4.25 11/25/2017 1456   HGB 14.0 11/25/2017 1456   HCT 41.5 11/25/2017 1456   PLT 143.0 (L) 11/25/2017 1456   MCV 97.8 11/25/2017 1456   MCH 30.9 05/28/2014 0434   MCHC 33.8 11/25/2017 1456   RDW 12.8 11/25/2017 1456    Hgb A1C Lab Results  Component Value Date   HGBA1C 6.9 (H) 11/25/2017            Assessment & Plan:   ER Follow up for Laceration of Left Thumb:  ER notes reviewed Sutures removed Pt tolerated well, no complications  Irregular Heart Beat:  Indication for ECG: irregular heart beat Interpretation of ECG: normal rate and rhythm, left atrial enlargement Comparison: 11/2015, unchanged  Return precautions discussed  Nicki Reaperegina Baity, NP

## 2018-06-25 NOTE — Patient Instructions (Signed)
Suture Removal, Care After  This sheet gives you information about how to care for yourself after your procedure. Your health care provider may also give you more specific instructions. If you have problems or questions, contact your health care provider.  What can I expect after the procedure?  After your stitches (sutures) are removed, it is common to have:  · Some discomfort and swelling in the area.  · Slight redness in the area.  Follow these instructions at home:  If you have a bandage:  · Wash your hands with soap and water before you change your bandage (dressing). If soap and water are not available, use hand sanitizer.  · Change your dressing as told by your health care provider. If your dressing becomes wet or dirty, or develops a bad smell, change it as soon as possible.  · If your dressing sticks to your skin, soak it in warm water to loosen it.  Wound care    · Check your wound every day for signs of infection. Check for:  ? More redness, swelling, or pain.  ? Fluid or blood.  ? Warmth.  ? Pus or a bad smell.  · Wash your hands with soap and water before and after touching your wound.  · Apply cream or ointment only as directed by your health care provider. If you are using cream or ointment, wash the area with soap and water 2 times a day to remove all the cream or ointment. Rinse off the soap and pat the area dry with a clean towel.  · If you have skin glue or adhesive strips on your wound, leave these closures in place. They may need to stay in place for 2 weeks or longer. If adhesive strip edges start to loosen and curl up, you may trim the loose edges. Do not remove adhesive strips completely unless your health care provider tells you to do that.  · Keep the wound area dry and clean. Do not take baths, swim, or use a hot tub until your health care provider approves.  · Continue to protect the wound from injury.  · Do not pick at your wound. Picking can cause an infection.  · When your wound has  completely healed, wear sunscreen over it or cover it with clothing when you are outside. New scars get sunburned easily, which can make scarring worse.  General instructions  · Take over-the-counter and prescription medicines only as told by your health care provider.  · Keep all follow-up visits as told by your health care provider. This is important.  Contact a health care provider if:  · You have redness, swelling, or pain around your wound.  · You have fluid or blood coming from your wound.  · Your wound feels warm to the touch.  · You have pus or a bad smell coming from your wound.  · Your wound opens up.  Get help right away if:  · You have a fever.  · You have redness that is spreading from your wound.  Summary  · After your sutures are removed, it is common to have some discomfort and swelling in the area.  · Wash your hands with soap and water before you change your bandage (dressing).  · Keep the wound area dry and clean. Do not take baths, swim, or use a hot tub until your health care provider approves.  This information is not intended to replace advice given to you by your health care   provider. Make sure you discuss any questions you have with your health care provider.  Document Released: 10/10/2000 Document Revised: 02/21/2016 Document Reviewed: 02/21/2016  Elsevier Interactive Patient Education © 2019 [employer]

## 2018-09-06 ENCOUNTER — Other Ambulatory Visit: Payer: Self-pay | Admitting: Internal Medicine

## 2018-09-10 ENCOUNTER — Other Ambulatory Visit: Payer: Self-pay | Admitting: Internal Medicine

## 2018-09-11 ENCOUNTER — Other Ambulatory Visit: Payer: Self-pay | Admitting: Internal Medicine

## 2018-09-12 ENCOUNTER — Other Ambulatory Visit: Payer: Self-pay | Admitting: Internal Medicine

## 2018-11-09 ENCOUNTER — Other Ambulatory Visit: Payer: Self-pay | Admitting: Internal Medicine

## 2018-11-27 ENCOUNTER — Encounter: Payer: Self-pay | Admitting: Internal Medicine

## 2018-11-27 ENCOUNTER — Ambulatory Visit (INDEPENDENT_AMBULATORY_CARE_PROVIDER_SITE_OTHER): Payer: 59 | Admitting: Internal Medicine

## 2018-11-27 ENCOUNTER — Other Ambulatory Visit: Payer: Self-pay

## 2018-11-27 VITALS — BP 124/76 | HR 91 | Temp 97.5°F | Ht 68.75 in | Wt 188.0 lb

## 2018-11-27 DIAGNOSIS — Z125 Encounter for screening for malignant neoplasm of prostate: Secondary | ICD-10-CM | POA: Diagnosis not present

## 2018-11-27 DIAGNOSIS — E7800 Pure hypercholesterolemia, unspecified: Secondary | ICD-10-CM | POA: Diagnosis not present

## 2018-11-27 DIAGNOSIS — Z0000 Encounter for general adult medical examination without abnormal findings: Secondary | ICD-10-CM | POA: Diagnosis not present

## 2018-11-27 DIAGNOSIS — J4520 Mild intermittent asthma, uncomplicated: Secondary | ICD-10-CM

## 2018-11-27 DIAGNOSIS — E119 Type 2 diabetes mellitus without complications: Secondary | ICD-10-CM

## 2018-11-27 DIAGNOSIS — K219 Gastro-esophageal reflux disease without esophagitis: Secondary | ICD-10-CM | POA: Diagnosis not present

## 2018-11-27 DIAGNOSIS — I10 Essential (primary) hypertension: Secondary | ICD-10-CM | POA: Diagnosis not present

## 2018-11-27 DIAGNOSIS — D508 Other iron deficiency anemias: Secondary | ICD-10-CM

## 2018-11-27 DIAGNOSIS — M160 Bilateral primary osteoarthritis of hip: Secondary | ICD-10-CM

## 2018-11-27 NOTE — Progress Notes (Signed)
Subjective:    Patient ID: Alan Butler, male    DOB: May 01, 1955, 63 y.o.   MRN: [id number]  HPI:   Pt presents to the clinic today for annual exam. Patient is also due for follow-up for chronic conditions.   Anemia: His last H/H was 14/41.5, 10/2017. He is taking an iron supplement daily. Patient denies any fatigue, cold intolerance, shortness of breath.   Asthma: Mild, intermittent. Patient takes Zyrtec daily and Albuterol as needed. There are no PFT's on file.   GERD: Hx of stomach ulcer. Patient continues to take Pantoprazole and take Malox for breakthrough.    HLD: His last HDL was 60, 10/2017 . Patient denies any myalgias on Simvastatin. He tries to consume a low fat diet.   HTN: His BP today is 124/76. He is taking Fosinopril daily as prescribed. ECG from 05/2018 reviewed.  Arthritis: Mainly in his left hip. He takes Naproxen as needed with good relief. He follows Dr Maureen Ralphs.   DM 2: His last A1C was 6.9%, 10/2017 .  Sugars range between 130-170. He is taking Metformin as prescribed. He does not routinely check his feet. His last eye exam was on 10/2018.  Flu: 10/2018 Tetanus: 05/2018 Pneumovax: 11/2016 Zostovax: never Shingrix: never PSA Screening:  10/2017 Colon Screening: > 10 years ago Vision Screening: annually Annually: biannually  Diet: He does eat meats, vegetables, fruits and fried foods. He drinks unsweet tea or water.  Exercise: None    Review of Systems      Past Medical History:  Diagnosis Date  . Anemia   . Asthma    prn inhaler  . Carpal tunnel syndrome on right 11/2015  . Complication of anesthesia 2001   hard to wake up post-op after shoulder surgery  . Cubital tunnel syndrome, right 11/2015  . Family history of adverse reaction to anesthesia    pt's father had reaction to anesthetic that is no longer used, per pt.  Marland Kitchen GERD (gastroesophageal reflux disease)   . History of chicken pox   . History of gastric ulcer   . History of kidney  stones   . Hypertension    states under control with med., has been on med. > 10 yr.  . Non-insulin dependent type 2 diabetes mellitus ([facility])   . Osteoarthritis of hip    right    Current Outpatient Medications  Medication Sig Dispense Refill  . aspirin EC 81 MG tablet Take 81 mg by mouth daily.    . cetirizine (ZYRTEC) 10 MG tablet Take 10 mg by mouth daily.    . cholecalciferol (VITAMIN D) 1000 units tablet Take 2,000 Units by mouth 2 (two) times daily.    . Clobetasol Prop Emollient Base (CLOBETASOL PROPIONATE E) 0.05 % emollient cream Apply 1 application topically as needed. 30 g 1  . Diclofenac Sodium 2 % SOLN Place 1 application onto the skin as needed.    . fosinopril (MONOPRIL) 40 MG tablet Take 1 tablet (40 mg total) by mouth daily. MUST SCHEDULE PHYSICAL 90 tablet 0  . metFORMIN (GLUCOPHAGE) 1000 MG tablet TAKE 1 TABLET (1,000 MG TOTAL) BY MOUTH 2 (TWO) TIMES DAILY WITH A MEAL. 180 tablet 0  . naproxen sodium (ANAPROX) 220 MG tablet Take 220 mg by mouth 2 (two) times daily with a meal.    . ONETOUCH DELICA LANCETS 02R MISC TEST TWICE A DAY AS NEEDED 100 each 9  . ONETOUCH VERIO test strip USE TO TEST BLOOD SUGAR TWICE DAILY AS NEEDED  50 strip 0  . pantoprazole (PROTONIX) 40 MG tablet Take 1 tablet (40 mg total) by mouth 2 (two) times daily. MUST SCHEDULE PHYSICAL 180 tablet 0  . simvastatin (ZOCOR) 40 MG tablet TAKE 1 TABLET BY MOUTH AT BEDTIME 30 tablet 0  . VENTOLIN HFA 108 (90 Base) MCG/ACT inhaler INHALE 1 PUFF INTO THE LUNGS EVERY 6 (SIX) HOURS AS NEEDED FOR WHEEZING OR SHORTNESS OF BREATH. 18 Inhaler 2   No current facility-administered medications for this visit.     No Known Allergies  Family History  Problem Relation Age of Onset  . Anesthesia problems Father        father had reaction to anesthetic agent that is no longer used, per pt.  . Diabetes Mother   . Diabetes Maternal Grandmother   . Diabetes Maternal Grandfather     Social History   Socioeconomic  History  . Marital status: Married    Spouse name: Not on file  . Number of children: Not on file  . Years of education: Not on file  . Highest education level: Not on file  Occupational History  . Not on file  Social Needs  . Financial resource strain: Not on file  . Food insecurity    Worry: Not on file    Inability: Not on file  . Transportation needs    Medical: Not on file    Non-medical: Not on file  Tobacco Use  . Smoking status: Never Smoker  . Smokeless tobacco: Never Used  Substance and Sexual Activity  . Alcohol use: No  . Drug use: No  . Sexual activity: Yes  Lifestyle  . Physical activity    Days per week: Not on file    Minutes per session: Not on file  . Stress: Not on file  Relationships  . Social Musicianconnections    Talks on phone: Not on file    Gets together: Not on file    Attends religious service: Not on file    Active member of club or organization: Not on file    Attends meetings of clubs or organizations: Not on file    Relationship status: Not on file  . Intimate partner violence    Fear of current or ex partner: Not on file    Emotionally abused: Not on file    Physically abused: Not on file    Forced sexual activity: Not on file  Other Topics Concern  . Not on file  Social History Narrative  . Not on file     Constitutional: Denies fever, malaise, fatigue, headache or abrupt weight changes.  Respiratory: Denies difficulty breathing, shortness of breath, cough or sputum production.   Cardiovascular: Denies chest pain, chest tightness, palpitations or swelling in the hands or feet.  Gastrointestinal: Pt repots intermittent reflux. Denies abdominal pain, bloating, constipation, diarrhea or blood in the stool.  GU: Denies urgency, frequency, pain with urination, burning sensation, blood in urine, odor or discharge. Musculoskeletal: Pt reports intermittent joint pain. Denies decrease in range of motion, difficulty with gait, muscle pain or joint  swelling.  Skin: Patient states he has bump on left side of posterior neck. Denies redness, rashes or ulcercations.  Neurological: Denies dizziness, difficulty with memory, difficulty with speech or problems with balance and coordination.  Psych: Denies anxiety, depression, SI/HI.  No other specific complaints in a complete review of systems (except as listed in HPI above).  Objective:   Physical Exam   BP 124/76   Pulse 91  Temp (!) 97.5 F (36.4 C) (Temporal)   Ht 5' 8.75" (1.746 m)   Wt 188 lb (85.3 kg)   SpO2 98%   BMI 27.97 kg/m  Wt Readings from Last 3 Encounters:  11/27/18 188 lb (85.3 kg)  06/24/18 191 lb (86.6 kg)  06/15/18 195 lb (88.5 kg)    General: Appears his stated age, well developed, well nourished in NAD. Skin: Round, encapsulated cyst-like lesion noted on left posterior neck. No pain with palpation.  HEENT: Head: normal shape and size; Eyes: sclera white, no icterus, conjunctiva pink, PERRLA and EOMs intact; Ears: Tm's gray and intact, normal light reflex; Nose: mucosa pink and moist, septum midline;  Neck:  Neck supple, trachea midline. No masses, lumps or thyromegaly present.  Cardiovascular: Normal rate and rhythm. S1,S2 noted.  No murmur, rubs or gallops noted. No JVD or BLE edema. No carotid bruits noted. Pulmonary/Chest: Normal effort and positive vesicular breath sounds. No respiratory distress. No wheezes, rales or ronchi noted.  Abdomen: Soft and nontender. Normal bowel sounds. No distention or masses noted. Liver, spleen and kidneys non palpable. Musculoskeletal: Strength 5/5 BUE/BLE. No difficulty with gait.  Neurological: Alert and oriented. Cranial nerves II-XII grossly intact. Coordination normal.  Psychiatric: Mood and affect normal. Behavior is normal. Judgment and thought content normal.     BMET    Component Value Date/Time   NA 141 11/25/2017 1456   K 4.1 11/25/2017 1456   CL 103 11/25/2017 1456   CO2 29 11/25/2017 1456   GLUCOSE  105 (H) 11/25/2017 1456   BUN 14 11/25/2017 1456   CREATININE 1.04 11/25/2017 1456   CALCIUM 9.9 11/25/2017 1456   GFRNONAA >90 05/28/2014 0434   GFRAA >90 05/28/2014 0434    Lipid Panel     Component Value Date/Time   CHOL 110 11/25/2017 1456   TRIG 56.0 11/25/2017 1456   HDL 38.90 (L) 11/25/2017 1456   CHOLHDL 3 11/25/2017 1456   VLDL 11.2 11/25/2017 1456   LDLCALC 60 11/25/2017 1456    CBC    Component Value Date/Time   WBC 7.4 11/25/2017 1456   RBC 4.25 11/25/2017 1456   HGB 14.0 11/25/2017 1456   HCT 41.5 11/25/2017 1456   PLT 143.0 (L) 11/25/2017 1456   MCV 97.8 11/25/2017 1456   MCH 30.9 05/28/2014 0434   MCHC 33.8 11/25/2017 1456   RDW 12.8 11/25/2017 1456    Hgb A1C Lab Results  Component Value Date   HGBA1C 6.9 (H) 11/25/2017           Assessment & Plan:  Preventative Health Maintenance:  Flu shot UTD Tetanus UTD Pneumovax UTD He declines Shingrix at this time He denies referral for colonoscopy at this time and is willing to try the cologuard- ordered.  Encouraged him to consume balanced diet and exercise regimen Advised him to see an eye doctor and dentist annually Will check CBC, CMET, Lipid, A1C and PSA today  RTC in 6 months to follow up chronic conditions. Nicki Reaper, NP

## 2018-11-28 LAB — LIPID PANEL
Cholesterol: 112 mg/dL (ref 0–200)
HDL: 35.6 mg/dL — ABNORMAL LOW (ref >39.00)
LDL Cholesterol: 63 mg/dL (ref 0–99)
NonHDL: 76.79
Total CHOL/HDL Ratio: 3
Triglycerides: 68 mg/dL (ref 0.0–149.0)
VLDL: 13.6 mg/dL (ref 0.0–40.0)

## 2018-11-28 LAB — CBC
HCT: 41.7 % (ref 39.0–52.0)
Hemoglobin: 14.1 g/dL (ref 13.0–17.0)
MCHC: 33.8 g/dL (ref 30.0–36.0)
MCV: 99.9 fl (ref 78.0–100.0)
Platelets: 158 10*3/uL (ref 150.0–400.0)
RBC: 4.17 Mil/uL — ABNORMAL LOW (ref 4.22–5.81)
RDW: 12.4 % (ref 11.5–15.5)
WBC: 8.2 10*3/uL (ref 4.0–10.5)

## 2018-11-28 LAB — COMPREHENSIVE METABOLIC PANEL
ALT: 22 U/L (ref 0–53)
AST: 22 U/L (ref 0–37)
Albumin: 4.8 g/dL (ref 3.5–5.2)
Alkaline Phosphatase: 57 U/L (ref 39–117)
BUN: 12 mg/dL (ref 6–23)
CO2: 31 mEq/L (ref 19–32)
Calcium: 10.2 mg/dL (ref 8.4–10.5)
Chloride: 103 mEq/L (ref 96–112)
Creatinine, Ser: 0.9 mg/dL (ref 0.40–1.50)
GFR: 85.4 mL/min (ref >60.00)
Glucose, Bld: 128 mg/dL — ABNORMAL HIGH (ref 70–99)
Potassium: 4.5 mEq/L (ref 3.5–5.1)
Sodium: 144 mEq/L (ref 135–145)
Total Bilirubin: 1 mg/dL (ref 0.2–1.2)
Total Protein: 7.1 g/dL (ref 6.0–8.3)

## 2018-11-28 LAB — PSA, MEDICARE: PSA: 1.66 ng/ml (ref 0.10–4.00)

## 2018-11-28 LAB — HEMOGLOBIN A1C: Hgb A1c MFr Bld: 7.7 % — ABNORMAL HIGH (ref 4.6–6.5)

## 2018-11-30 ENCOUNTER — Encounter: Payer: Self-pay | Admitting: Internal Medicine

## 2018-11-30 NOTE — Assessment & Plan Note (Signed)
A1c today No urine microalbumin secondary to ACE therapy Encouraged him to consume a low carb diet and exercise for weight loss Continue Metformin Encouraged yearly eye exam Foot exam today Flu and Pneumovax UTD

## 2018-11-30 NOTE — Assessment & Plan Note (Signed)
Continue Zyrtec and Albuterol as needed We will monitor

## 2018-11-30 NOTE — Assessment & Plan Note (Signed)
Continue Naproxen as needed Encouraged regular stretching and physical activity

## 2018-11-30 NOTE — Patient Instructions (Signed)

## 2018-11-30 NOTE — Assessment & Plan Note (Signed)
Discussed how weight loss and avoiding triggers will help reduce reflux symptoms Continue Pantoprazole and Maalox CBC and C met today

## 2018-11-30 NOTE — Assessment & Plan Note (Signed)
Controlled on Fosinopril C met today Reinforced DASH diet and exercise for weight loss

## 2018-11-30 NOTE — Assessment & Plan Note (Signed)
C met and lipid profile today Encouraged him to consume a low-fat diet Continue simvastatin for now

## 2018-11-30 NOTE — Assessment & Plan Note (Signed)
CBC today Continue iron supplement 

## 2018-12-05 MED ORDER — GLIPIZIDE ER 2.5 MG PO TB24: 2.5000 mg | ORAL_TABLET | Freq: Every day | ORAL | 2 refills | Status: DC

## 2018-12-05 NOTE — Addendum Note (Signed)
Addended by: Lurlean Nanny on: 12/05/2018 06:29 PM   Modules accepted: Orders

## 2018-12-09 ENCOUNTER — Other Ambulatory Visit: Payer: Self-pay | Admitting: Internal Medicine

## 2018-12-11 ENCOUNTER — Other Ambulatory Visit: Payer: Self-pay | Admitting: Internal Medicine

## 2018-12-12 ENCOUNTER — Other Ambulatory Visit: Payer: Self-pay | Admitting: Internal Medicine

## 2019-01-16 ENCOUNTER — Other Ambulatory Visit: Payer: Self-pay | Admitting: Internal Medicine

## 2019-02-27 ENCOUNTER — Other Ambulatory Visit: Payer: Self-pay | Admitting: Internal Medicine

## 2019-03-12 ENCOUNTER — Ambulatory Visit (INDEPENDENT_AMBULATORY_CARE_PROVIDER_SITE_OTHER): Payer: 59 | Admitting: Internal Medicine

## 2019-03-12 ENCOUNTER — Ambulatory Visit (INDEPENDENT_AMBULATORY_CARE_PROVIDER_SITE_OTHER)
Admission: RE | Admit: 2019-03-12 | Discharge: 2019-03-12 | Disposition: A | Discharge status: Home / Self Care | Payer: 59 | Source: Ambulatory Visit | Attending: Internal Medicine | Admitting: Internal Medicine

## 2019-03-12 ENCOUNTER — Other Ambulatory Visit: Payer: Self-pay

## 2019-03-12 ENCOUNTER — Encounter: Payer: Self-pay | Admitting: Internal Medicine

## 2019-03-12 VITALS — BP 128/68 | HR 78 | Temp 97.5°F | Wt 189.0 lb

## 2019-03-12 DIAGNOSIS — E119 Type 2 diabetes mellitus without complications: Secondary | ICD-10-CM | POA: Diagnosis not present

## 2019-03-12 DIAGNOSIS — H938X1 Other specified disorders of right ear: Secondary | ICD-10-CM | POA: Diagnosis not present

## 2019-03-12 DIAGNOSIS — M545 Low back pain: Secondary | ICD-10-CM | POA: Diagnosis not present

## 2019-03-12 DIAGNOSIS — G8929 Other chronic pain: Secondary | ICD-10-CM

## 2019-03-12 LAB — POCT GLYCOSYLATED HEMOGLOBIN (HGB A1C): Hemoglobin A1C: 6.8 % — AB (ref 4.0–5.6)

## 2019-03-12 MED ORDER — CYCLOBENZAPRINE HCL 10 MG PO TABS: 10.0000 mg | ORAL_TABLET | Freq: Every evening | ORAL | 0 refills | Status: AC | PRN

## 2019-03-12 NOTE — Progress Notes (Signed)
Subjective:    Patient ID: Alan Butler, male    DOB: 04-02-1955, 64 y.o.   MRN: [id number]  HPI  Pt presents to the clinic today with c/o back pain. This started 2-3 months ago. He describes the pain as intermittent sharp and stabbing. The pain is worse with sudden movements. The pain does not radiate up his back or down his legs. He reports associated muscle tightness. He denies numbness, tingling or weakness of the lower extremities. He denies issues with bowel or bladder. He denies any previous injury to the area or back surgeries.  He has a history of kidney stones but reports this feels different. He has tried Ibuprofen and Aleve with minimal relief.  He is due for recheck of A1C. His last A1C was 7.7%, 10/2018. His sugars range 117-181. He is taking Metformin and Glipizide as prescribed. He is on Fosinopril for HTN control and renal protection. He is on Simvastatin for treatment of high cholesterol. Flu 10/2018. Pneumovax 11/2016.  He also c/o right ear fullness. This started a few days ago. He reports a humming noise in his right ear but denies pain, drainage or loss of hearing. He has not tried anything OTC for this.  Review of Systems      Past Medical History:  Diagnosis Date  . Anemia   . Asthma    prn inhaler  . Carpal tunnel syndrome on right 11/2015  . Complication of anesthesia 2001   hard to wake up post-op after shoulder surgery  . Cubital tunnel syndrome, right 11/2015  . Family history of adverse reaction to anesthesia    pt's father had reaction to anesthetic that is no longer used, per pt.  Marland Kitchen GERD (gastroesophageal reflux disease)   . History of chicken pox   . History of gastric ulcer   . History of kidney stones   . Hypertension    states under control with med., has been on med. > 10 yr.  . Non-insulin dependent type 2 diabetes mellitus ([place])   . Osteoarthritis of hip    right    Current Outpatient Medications  Medication Sig Dispense Refill  .  aspirin EC 81 MG tablet Take 81 mg by mouth daily.    . cetirizine (ZYRTEC) 10 MG tablet Take 10 mg by mouth daily.    . cholecalciferol (VITAMIN D) 1000 units tablet Take 2,000 Units by mouth 2 (two) times daily.    . Clobetasol Prop Emollient Base (CLOBETASOL PROPIONATE E) 0.05 % emollient cream Apply 1 application topically as needed. 30 g 1  . Diclofenac Sodium 2 % SOLN Place 1 application onto the skin as needed.    . fosinopril (MONOPRIL) 40 MG tablet Take 1 tablet (40 mg total) by mouth daily. 90 tablet 1  . glipiZIDE (GLUCOTROL XL) 2.5 MG 24 hr tablet TAKE 1 TABLET (2.5 MG TOTAL) BY MOUTH DAILY WITH BREAKFAST. 30 tablet 0  . metFORMIN (GLUCOPHAGE) 1000 MG tablet TAKE 1 TABLET (1,000 MG TOTAL) BY MOUTH 2 (TWO) TIMES DAILY WITH A MEAL. 180 tablet 1  . naproxen sodium (ANAPROX) 220 MG tablet Take 220 mg by mouth 2 (two) times daily with a meal.    . ONETOUCH DELICA LANCETS 60V MISC TEST TWICE A DAY AS NEEDED 100 each 9  . ONETOUCH VERIO test strip USE TO TEST BLOOD SUGAR TWICE DAILY AS NEEDED 50 strip 0  . pantoprazole (PROTONIX) 40 MG tablet Take 1 tablet (40 mg total) by mouth 2 (two) times  daily. 180 tablet 1  . simvastatin (ZOCOR) 40 MG tablet Take 1 tablet (40 mg total) by mouth daily at 6 PM. 90 tablet 1  . VENTOLIN HFA 108 (90 Base) MCG/ACT inhaler INHALE 1 PUFF INTO THE LUNGS EVERY 6 (SIX) HOURS AS NEEDED FOR WHEEZING OR SHORTNESS OF BREATH. 18 Inhaler 2   No current facility-administered medications for this visit.    No Known Allergies  Family History  Problem Relation Age of Onset  . Anesthesia problems Father        father had reaction to anesthetic agent that is no longer used, per pt.  . Diabetes Mother   . Diabetes Maternal Grandmother   . Diabetes Maternal Grandfather     Social History   Socioeconomic History  . Marital status: Married    Spouse name: Not on file  . Number of children: Not on file  . Years of education: Not on file  . Highest education level:  Not on file  Occupational History  . Not on file  Tobacco Use  . Smoking status: Never Smoker  . Smokeless tobacco: Never Used  Substance and Sexual Activity  . Alcohol use: No  . Drug use: No  . Sexual activity: Yes  Other Topics Concern  . Not on file  Social History Narrative  . Not on file   Social Determinants of Health   Financial Resource Strain:   . Difficulty of Paying Living Expenses: Not on file  Food Insecurity:   . Worried About Programme researcher, broadcasting/film/video in the Last Year: Not on file  . Ran Out of Food in the Last Year: Not on file  Transportation Needs:   . Lack of Transportation (Medical): Not on file  . Lack of Transportation (Non-Medical): Not on file  Physical Activity:   . Days of Exercise per Week: Not on file  . Minutes of Exercise per Session: Not on file  Stress:   . Feeling of Stress : Not on file  Social Connections:   . Frequency of Communication with Friends and Family: Not on file  . Frequency of Social Gatherings with Friends and Family: Not on file  . Attends Religious Services: Not on file  . Active Member of Clubs or Organizations: Not on file  . Attends Banker Meetings: Not on file  . Marital Status: Not on file  Intimate Partner Violence:   . Fear of Current or Ex-Partner: Not on file  . Emotionally Abused: Not on file  . Physically Abused: Not on file  . Sexually Abused: Not on file     Constitutional: Denies fever, malaise, fatigue, headache or abrupt weight changes.  HEENT: Pt reports right ear fullness. Denies eye pain, eye redness, ear pain, ringing in the ears, wax buildup, runny nose, nasal congestion, bloody nose, or sore throat. Respiratory: Denies difficulty breathing, shortness of breath, cough or sputum production.   Cardiovascular: Denies chest pain, chest tightness, palpitations or swelling in the hands or feet.  Gastrointestinal: Denies abdominal pain, bloating, constipation, diarrhea or blood in the stool.    GU: Denies urgency, frequency, pain with urination, burning sensation, blood in urine, odor or discharge. Musculoskeletal: Pt reports low back pain, decrease in ROM. Denies difficulty with gait, or joint  swelling.  Skin: Denies redness, rashes, lesions or ulcercations.  Neurological: Denies numbness, tingling, weakness or problems with balance and coordination.    No other specific complaints in a complete review of systems (except as listed in HPI above).  Objective:   Physical Exam  BP 128/68   Pulse 78   Temp (!) 97.5 F (36.4 C) (Temporal)   Wt 189 lb (85.7 kg)   SpO2 98%   BMI 28.11 kg/m   Wt Readings from Last 3 Encounters:  11/27/18 188 lb (85.3 kg)  06/24/18 191 lb (86.6 kg)  06/15/18 195 lb (88.5 kg)    General: Appears his stated age, well developed, well nourished in NAD. Skin: Warm, dry and intact. No rashes noted. HEENT: Head: normal shape and size; Eyes: sclera white, no icterus, conjunctiva pink, PERRLA and EOMs intact; Ears: Tm's gray and intact, normal light reflex;  Cardiovascular: Normal rate and rhythm. S1,S2 noted.  No murmur, rubs or gallops noted.  Pulmonary/Chest: Normal effort and positive vesicular breath sounds. No respiratory distress. No wheezes, rales or ronchi noted.  Musculoskeletal: Decreased flexion, extension, rotation and lateral bending of the spine. No bony tenderness noted over the spine. No pain with palpation of bilateral paralumbar muscles. Strength 5/5 BLE. Able to stand on heels and tip toes. No difficulty with gait.  Neurological: Alert and oriented. Sensation intact to BLE.   BMET    Component Value Date/Time   NA 144 11/27/2018 1458   K 4.5 11/27/2018 1458   CL 103 11/27/2018 1458   CO2 31 11/27/2018 1458   GLUCOSE 128 (H) 11/27/2018 1458   BUN 12 11/27/2018 1458   CREATININE 0.90 11/27/2018 1458   CALCIUM 10.2 11/27/2018 1458   GFRNONAA >90 05/28/2014 0434   GFRAA >90 05/28/2014 0434    Lipid Panel     Component  Value Date/Time   CHOL 112 11/27/2018 1458   TRIG 68.0 11/27/2018 1458   HDL 35.60 (L) 11/27/2018 1458   CHOLHDL 3 11/27/2018 1458   VLDL 13.6 11/27/2018 1458   LDLCALC 63 11/27/2018 1458    CBC    Component Value Date/Time   WBC 8.2 11/27/2018 1458   RBC 4.17 (L) 11/27/2018 1458   HGB 14.1 11/27/2018 1458   HCT 41.7 11/27/2018 1458   PLT 158.0 11/27/2018 1458   MCV 99.9 11/27/2018 1458   MCH 30.9 05/28/2014 0434   MCHC 33.8 11/27/2018 1458   RDW 12.4 11/27/2018 1458    Hgb A1C Lab Results  Component Value Date   HGBA1C 7.7 (H) 11/27/2018            Assessment & Plan:   Chronic Low Back Pain:  Xray lumbar spine Continue Aleve or Ibuprofen OTC Encouraged heat and stretching daily RX for Flexeril 10 mg QHS prn- sedation caution given Consider PT pending xrays  Right Ear Fullness:  No s/s of infection Recommend Flonase 1 spray right nostril BID x 3 days then daily thereafter  DM 2:  A1C today No urine microalbumin secondary to ACEI therapy Encouraged him to consume a low carb diet, increase aerobic exercise Encouraged yearly eye exam Foot exam today Continue Metformin and Glipizide Flu and pneumovax UTD  Will follow up after xray, return precautions discussed  Nicki Reaper, NP. This visit occurred during the SARS-CoV-2 public health emergency.  Safety protocols were in place, including screening questions prior to the visit, additional usage of staff PPE, and extensive cleaning of exam room while observing appropriate contact time as indicated for disinfecting solutions.

## 2019-03-12 NOTE — Patient Instructions (Signed)

## 2019-03-17 MED ORDER — METFORMIN HCL 1000 MG PO TABS: 1000.0000 mg | ORAL_TABLET | Freq: Two times a day (BID) | ORAL | 1 refills | Status: DC

## 2019-03-17 MED ORDER — GLIPIZIDE ER 2.5 MG PO TB24: 2.5000 mg | ORAL_TABLET | Freq: Every day | ORAL | 1 refills | Status: DC

## 2019-03-17 NOTE — Addendum Note (Signed)
Addended by: Roena Malady on: 03/17/2019 05:03 PM   Modules accepted: Orders

## 2019-04-17 ENCOUNTER — Other Ambulatory Visit: Payer: Self-pay | Admitting: Internal Medicine

## 2019-04-17 NOTE — Telephone Encounter (Signed)
Last filled 10/2017... please advise 

## 2019-05-10 ENCOUNTER — Other Ambulatory Visit: Payer: Self-pay | Admitting: Internal Medicine

## 2019-06-05 ENCOUNTER — Other Ambulatory Visit: Payer: Self-pay | Admitting: Internal Medicine

## 2019-06-12 ENCOUNTER — Other Ambulatory Visit: Payer: Self-pay | Admitting: Internal Medicine

## 2019-06-28 ENCOUNTER — Other Ambulatory Visit: Payer: Self-pay | Admitting: Internal Medicine

## 2019-09-08 ENCOUNTER — Other Ambulatory Visit: Payer: Self-pay | Admitting: Internal Medicine

## 2019-09-12 ENCOUNTER — Other Ambulatory Visit: Payer: Self-pay | Admitting: Internal Medicine

## 2019-10-11 ENCOUNTER — Other Ambulatory Visit: Payer: Self-pay | Admitting: Internal Medicine

## 2019-11-02 ENCOUNTER — Other Ambulatory Visit: Payer: Self-pay | Admitting: Internal Medicine

## 2019-11-20 ENCOUNTER — Other Ambulatory Visit: Payer: Self-pay | Admitting: Internal Medicine

## 2019-12-08 ENCOUNTER — Other Ambulatory Visit: Payer: Self-pay | Admitting: Internal Medicine

## 2019-12-08 DIAGNOSIS — E119 Type 2 diabetes mellitus without complications: Secondary | ICD-10-CM

## 2019-12-09 ENCOUNTER — Other Ambulatory Visit: Payer: Self-pay | Admitting: Internal Medicine

## 2019-12-09 NOTE — Telephone Encounter (Signed)
Pt has appt tomorrow, will wait for labs

## 2019-12-10 ENCOUNTER — Ambulatory Visit (INDEPENDENT_AMBULATORY_CARE_PROVIDER_SITE_OTHER): Payer: BC Managed Care – PPO | Admitting: Internal Medicine

## 2019-12-10 ENCOUNTER — Encounter: Payer: Self-pay | Admitting: Internal Medicine

## 2019-12-10 ENCOUNTER — Other Ambulatory Visit: Payer: Self-pay

## 2019-12-10 VITALS — BP 124/72 | HR 79 | Temp 97.3°F | Ht 68.75 in | Wt 193.0 lb

## 2019-12-10 DIAGNOSIS — Z125 Encounter for screening for malignant neoplasm of prostate: Secondary | ICD-10-CM

## 2019-12-10 DIAGNOSIS — Z0001 Encounter for general adult medical examination with abnormal findings: Secondary | ICD-10-CM

## 2019-12-10 DIAGNOSIS — I10 Essential (primary) hypertension: Secondary | ICD-10-CM

## 2019-12-10 DIAGNOSIS — D508 Other iron deficiency anemias: Secondary | ICD-10-CM

## 2019-12-10 DIAGNOSIS — M160 Bilateral primary osteoarthritis of hip: Secondary | ICD-10-CM | POA: Diagnosis not present

## 2019-12-10 DIAGNOSIS — E119 Type 2 diabetes mellitus without complications: Secondary | ICD-10-CM | POA: Diagnosis not present

## 2019-12-10 DIAGNOSIS — Z0000 Encounter for general adult medical examination without abnormal findings: Secondary | ICD-10-CM | POA: Diagnosis not present

## 2019-12-10 DIAGNOSIS — K219 Gastro-esophageal reflux disease without esophagitis: Secondary | ICD-10-CM

## 2019-12-10 DIAGNOSIS — J4520 Mild intermittent asthma, uncomplicated: Secondary | ICD-10-CM | POA: Diagnosis not present

## 2019-12-10 DIAGNOSIS — E7800 Pure hypercholesterolemia, unspecified: Secondary | ICD-10-CM

## 2019-12-10 LAB — COMPREHENSIVE METABOLIC PANEL
ALT: 30 U/L (ref 0–53)
AST: 31 U/L (ref 0–37)
Albumin: 4.5 g/dL (ref 3.5–5.2)
Alkaline Phosphatase: 50 U/L (ref 39–117)
BUN: 14 mg/dL (ref 6–23)
CO2: 31 mEq/L (ref 19–32)
Calcium: 9.6 mg/dL (ref 8.4–10.5)
Chloride: 103 mEq/L (ref 96–112)
Creatinine, Ser: 0.98 mg/dL (ref 0.40–1.50)
GFR: 82.13 mL/min (ref >60.00)
Glucose, Bld: 182 mg/dL — ABNORMAL HIGH (ref 70–99)
Potassium: 4.5 mEq/L (ref 3.5–5.1)
Sodium: 141 mEq/L (ref 135–145)
Total Bilirubin: 0.8 mg/dL (ref 0.2–1.2)
Total Protein: 6.7 g/dL (ref 6.0–8.3)

## 2019-12-10 LAB — LIPID PANEL
Cholesterol: 112 mg/dL (ref 0–200)
HDL: 36.1 mg/dL — ABNORMAL LOW (ref >39.00)
LDL Cholesterol: 63 mg/dL (ref 0–99)
NonHDL: 75.92
Total CHOL/HDL Ratio: 3
Triglycerides: 63 mg/dL (ref 0.0–149.0)
VLDL: 12.6 mg/dL (ref 0.0–40.0)

## 2019-12-10 LAB — CBC
HCT: 41 % (ref 39.0–52.0)
Hemoglobin: 14 g/dL (ref 13.0–17.0)
MCHC: 34.2 g/dL (ref 30.0–36.0)
MCV: 97.6 fl (ref 78.0–100.0)
Platelets: 156 10*3/uL (ref 150.0–400.0)
RBC: 4.2 Mil/uL — ABNORMAL LOW (ref 4.22–5.81)
RDW: 12.6 % (ref 11.5–15.5)
WBC: 5.9 10*3/uL (ref 4.0–10.5)

## 2019-12-10 LAB — PSA: PSA: 2.29 ng/mL (ref 0.10–4.00)

## 2019-12-10 LAB — HEMOGLOBIN A1C: Hgb A1c MFr Bld: 7.8 % — ABNORMAL HIGH (ref 4.6–6.5)

## 2019-12-10 NOTE — Assessment & Plan Note (Signed)
CMET and lipid profile today Encouraged him to consume a low fat diet Continue Simvastatin 

## 2019-12-10 NOTE — Assessment & Plan Note (Signed)
Continue Zyrtec and Albuterol Will monitor

## 2019-12-10 NOTE — Assessment & Plan Note (Signed)
CBC today Continue oral iron 

## 2019-12-10 NOTE — Progress Notes (Signed)
Subjective:    Patient ID: Alan Butler, male    DOB: Sep 29, 1955, 64 y.o.   MRN: [id number]  HPI  Pt presents to the clinic today for his annual exam. He is also due to follow up chronic conditions.  Anemia: His last H/H was 4.1/41.7, 10/2018. He is taking an oral iron supplement daily. He denies s/s of bleeding.  Asthma: Mild, intermittent. Hetakes Zyrtec daily and Albuterol as needed. There are no PFT's on file.   GERD: Hx of stomach ulcer. He denies breakthrough on Pantoprazole. There is no upper GI on file.  HLD: His last LDL was 63, 10/2018. He denies myalgias on Simvastatin. He tries to consume a low fat diet.   HTN: His BP today is 124/72. He is taking Fosinopril as prescribed. ECG from 05/2018 reviewed.  OA: Mainly in his left hip. He takes Naproxen as needed with good relief of symptoms.  DM 2: His last A1C was 6.8%, 03/2019. His sugars usually are < 200. He is taking Metformin and Glipizide as prescribed. He does not routinely check his feet. His last eye exam was 10/2018.  Flu: 11/2019 Tetanus: 05/2018 Pneumovax: 11/2016 Covid: Pfizer Zostovax: never Shingrix: never PSA Screening: 10/2018 Colon Screening: > 10 years ago Vision Screening: annually Dentist: biannually  Diet: He does eat meat. He consumes more veggies than fruits. He does eat some fried foods. He drinks mostly water. Exercise: None outside of work   Review of Systems  Past Medical History:  Diagnosis Date  . Anemia   . Asthma    prn inhaler  . Carpal tunnel syndrome on right 11/2015  . Complication of anesthesia 2001   hard to wake up post-op after shoulder surgery  . Cubital tunnel syndrome, right 11/2015  . Family history of adverse reaction to anesthesia    pt's father had reaction to anesthetic that is no longer used, per pt.  Marland Kitchen GERD (gastroesophageal reflux disease)   . History of chicken pox   . History of gastric ulcer   . History of kidney stones   . Hypertension    states  under control with med., has been on med. > 10 yr.  . Non-insulin dependent type 2 diabetes mellitus ([facility])   . Osteoarthritis of hip    right    Current Outpatient Medications  Medication Sig Dispense Refill  . aspirin EC 81 MG tablet Take 81 mg by mouth daily.    . cetirizine (ZYRTEC) 10 MG tablet Take 10 mg by mouth daily.    . cholecalciferol (VITAMIN D) 1000 units tablet Take 2,000 Units by mouth 2 (two) times daily.    . Clobetasol Prop Emollient Base 0.05 % emollient cream APPLY 1 APPLICATION TOPICALLY AS NEEDED. 15 g 3  . cyclobenzaprine (FLEXERIL) 10 MG tablet Take 1 tablet (10 mg total) by mouth at bedtime as needed for muscle spasms. 20 tablet 0  . Diclofenac Sodium 2 % SOLN Place 1 application onto the skin as needed.    . fosinopril (MONOPRIL) 40 MG tablet TAKE 1 TABLET BY MOUTH EVERY DAY 90 tablet 0  . glipiZIDE (GLUCOTROL XL) 2.5 MG 24 hr tablet TAKE 1 TABLET (2.5 MG TOTAL) BY MOUTH DAILY WITH BREAKFAST. 90 tablet 0  . metFORMIN (GLUCOPHAGE) 1000 MG tablet Take 1 tablet (1,000 mg total) by mouth 2 (two) times daily with a meal. 180 tablet 1  . naproxen sodium (ANAPROX) 220 MG tablet Take 220 mg by mouth 2 (two) times daily with a meal.    .  OneTouch Delica Lancets 33G MISC TEST TWICE A DAY AS NEEDED 100 each 9  . ONETOUCH VERIO test strip USE TO TEST BLOOD SUGAR TWICE DAILY AS NEEDED 50 strip 0  . pantoprazole (PROTONIX) 40 MG tablet TAKE 1 TABLET BY MOUTH TWICE A DAY 180 tablet 0  . simvastatin (ZOCOR) 40 MG tablet TAKE 1 TABLET (40 MG TOTAL) BY MOUTH DAILY AT 6 PM. 90 tablet 0  . VENTOLIN HFA 108 (90 Base) MCG/ACT inhaler INHALE 1 PUFF INTO THE LUNGS EVERY 6 (SIX) HOURS AS NEEDED FOR WHEEZING OR SHORTNESS OF BREATH. 18 Inhaler 2   No current facility-administered medications for this visit.    No Known Allergies  Family History  Problem Relation Age of Onset  . Anesthesia problems Father        father had reaction to anesthetic agent that is no longer used, per pt.  .  Diabetes Mother   . Diabetes Maternal Grandmother   . Diabetes Maternal Grandfather     Social History   Socioeconomic History  . Marital status: Married    Spouse name: Not on file  . Number of children: Not on file  . Years of education: Not on file  . Highest education level: Not on file  Occupational History  . Not on file  Tobacco Use  . Smoking status: Never Smoker  . Smokeless tobacco: Never Used  Vaping Use  . Vaping Use: Never used  Substance and Sexual Activity  . Alcohol use: No  . Drug use: No  . Sexual activity: Yes  Other Topics Concern  . Not on file  Social History Narrative  . Not on file   Social Determinants of Health   Financial Resource Strain:   . Difficulty of Paying Living Expenses: Not on file  Food Insecurity:   . Worried About Programme researcher, broadcasting/film/video in the Last Year: Not on file  . Ran Out of Food in the Last Year: Not on file  Transportation Needs:   . Lack of Transportation (Medical): Not on file  . Lack of Transportation (Non-Medical): Not on file  Physical Activity:   . Days of Exercise per Week: Not on file  . Minutes of Exercise per Session: Not on file  Stress:   . Feeling of Stress : Not on file  Social Connections:   . Frequency of Communication with Friends and Family: Not on file  . Frequency of Social Gatherings with Friends and Family: Not on file  . Attends Religious Services: Not on file  . Active Member of Clubs or Organizations: Not on file  . Attends Banker Meetings: Not on file  . Marital Status: Not on file  Intimate Partner Violence:   . Fear of Current or Ex-Partner: Not on file  . Emotionally Abused: Not on file  . Physically Abused: Not on file  . Sexually Abused: Not on file     Constitutional: Denies fever, malaise, fatigue, headache or abrupt weight changes.  HEENT: Denies eye pain, eye redness, ear pain, ringing in the ears, wax buildup, runny nose, nasal congestion, bloody nose, or sore  throat. Respiratory: Denies difficulty breathing, shortness of breath, cough or sputum production.   Cardiovascular: Denies chest pain, chest tightness, palpitations or swelling in the hands or feet.  Gastrointestinal: Denies abdominal pain, bloating, constipation, diarrhea or blood in the stool.  GU: Denies urgency, frequency, pain with urination, burning sensation, blood in urine, odor or discharge. Musculoskeletal: Pt reports intermittent left hip pain.  Denies decrease in range of motion, difficulty with gait, muscle pain or joint swelling.  Skin: Denies redness, rashes, lesions or ulcercations.  Neurological: Denies dizziness, difficulty with memory, difficulty with speech or problems with balance and coordination.  Psych: Denies anxiety, depression, SI/HI.  No other specific complaints in a complete review of systems (except as listed in HPI above).     Objective:   Physical Exam  BP 124/72   Pulse 79   Temp (!) 97.3 F (36.3 C) (Temporal)   Ht 5' 8.75" (1.746 m)   Wt 193 lb (87.5 kg)   SpO2 97%   BMI 28.71 kg/m   Wt Readings from Last 3 Encounters:  03/12/19 189 lb (85.7 kg)  11/27/18 188 lb (85.3 kg)  06/24/18 191 lb (86.6 kg)    General: Appears his tated age, well developed, well nourished in NAD. Skin: Warm, dry and intact. No ulcerations noted. HEENT: Head: normal shape and size; Eyes: sclera white, no icterus, conjunctiva pink, PERRLA and EOMs intact;  Neck:  Neck supple, trachea midline. No masses, lumps or thyromegaly present.  Cardiovascular: Normal rate and rhythm. S1,S2 noted.  No murmur, rubs or gallops noted. No JVD or BLE edema. No carotid bruits noted. Pulmonary/Chest: Normal effort and positive vesicular breath sounds. No respiratory distress. No wheezes, rales or ronchi noted.  Abdomen: Soft and nontender. Normal bowel sounds. Ventral hernia noted. Liver, spleen and kidneys non palpable. Musculoskeletal: Strength 5/5 BUE/BLE. No difficulty with gait.    Neurological: Alert and oriented. Cranial nerves II-XII grossly intact. Coordination normal.  Psychiatric: Mood and affect normal. Behavior is normal. Judgment and thought content normal.    BMET    Component Value Date/Time   NA 144 11/27/2018 1458   K 4.5 11/27/2018 1458   CL 103 11/27/2018 1458   CO2 31 11/27/2018 1458   GLUCOSE 128 (H) 11/27/2018 1458   BUN 12 11/27/2018 1458   CREATININE 0.90 11/27/2018 1458   CALCIUM 10.2 11/27/2018 1458   GFRNONAA >90 05/28/2014 0434   GFRAA >90 05/28/2014 0434    Lipid Panel     Component Value Date/Time   CHOL 112 11/27/2018 1458   TRIG 68.0 11/27/2018 1458   HDL 35.60 (L) 11/27/2018 1458   CHOLHDL 3 11/27/2018 1458   VLDL 13.6 11/27/2018 1458   LDLCALC 63 11/27/2018 1458    CBC    Component Value Date/Time   WBC 8.2 11/27/2018 1458   RBC 4.17 (L) 11/27/2018 1458   HGB 14.1 11/27/2018 1458   HCT 41.7 11/27/2018 1458   PLT 158.0 11/27/2018 1458   MCV 99.9 11/27/2018 1458   MCH 30.9 05/28/2014 0434   MCHC 33.8 11/27/2018 1458   RDW 12.4 11/27/2018 1458    Hgb A1C Lab Results  Component Value Date   HGBA1C 6.8 (A) 03/12/2019            Assessment & Plan:   Preventative Health Maintenance:  Flu shot UTD Tetanus UTD Pneumovax UTD He will consider Shingrix vaccine He declines referral for colonoscopy at this time- unable to take time off work Encouraged him to consume a balanced diet and exercise regimen Advised him to see an eye doctor and dentist annually Will check CBC, CMET, Lipid, A1C and PSA today  RTC in 6 months, follow up chronic conditions Nicki Reaper, NP This visit occurred during the SARS-CoV-2 public health emergency.  Safety protocols were in place, including screening questions prior to the visit, additional usage of staff PPE, and extensive cleaning  of exam room while observing appropriate contact time as indicated for disinfecting solutions.

## 2019-12-10 NOTE — Assessment & Plan Note (Signed)
A1C today No urine microalbumin secondary to ACEI therapy Continue Metformin and Glipizide today Encouraged him to consume a low carb diet, exercise for weight loss Encouraged him to schedule his eye exam Foot exam today Flu, pneumovax and Covid vaccine UTD

## 2019-12-10 NOTE — Patient Instructions (Signed)

## 2019-12-10 NOTE — Assessment & Plan Note (Signed)
Continue Naproxen as needed 

## 2019-12-10 NOTE — Assessment & Plan Note (Signed)
Controlled on Fosinopril Reinforced DASH diet and exercise for weight loss CMET today Will monitor

## 2019-12-10 NOTE — Assessment & Plan Note (Signed)
CBC and CMET today Avoid foods that trigger reflux, laying down within 2 hours of eating Continue Pantoprazole for now

## 2019-12-11 ENCOUNTER — Other Ambulatory Visit: Payer: Self-pay | Admitting: Internal Medicine

## 2019-12-12 MED ORDER — SIMVASTATIN 40 MG PO TABS: 40.0000 mg | ORAL_TABLET | Freq: Every day | ORAL | 2 refills | Status: AC

## 2019-12-12 MED ORDER — GLIPIZIDE ER 5 MG PO TB24: 5.0000 mg | ORAL_TABLET | Freq: Every day | ORAL | 0 refills | Status: DC

## 2019-12-12 MED ORDER — METFORMIN HCL 1000 MG PO TABS
1000.0000 mg | ORAL_TABLET | Freq: Two times a day (BID) | ORAL | 0 refills | Status: DC
Start: 2019-12-12 — End: 2020-03-18

## 2020-01-04 ENCOUNTER — Telehealth: Payer: Self-pay | Admitting: *Deleted

## 2020-01-04 NOTE — Telephone Encounter (Signed)
Patient's wife called stating that his insurance company is requiring a PA on Pantoprazole. Patient's wife stated that he will run out of his medication Sunday. Patient's wife stated that the pharmacy should be sending information on this.Marland Kitchen

## 2020-01-05 NOTE — Telephone Encounter (Signed)
PA submitted via [email or web address] and has been approved

## 2020-02-01 ENCOUNTER — Other Ambulatory Visit: Payer: Self-pay | Admitting: Internal Medicine

## 2020-02-21 ENCOUNTER — Other Ambulatory Visit: Payer: Self-pay | Admitting: Internal Medicine

## 2020-03-11 ENCOUNTER — Other Ambulatory Visit: Payer: Self-pay

## 2020-03-11 ENCOUNTER — Other Ambulatory Visit (INDEPENDENT_AMBULATORY_CARE_PROVIDER_SITE_OTHER): Payer: BC Managed Care – PPO

## 2020-03-11 DIAGNOSIS — E119 Type 2 diabetes mellitus without complications: Secondary | ICD-10-CM | POA: Diagnosis not present

## 2020-03-11 LAB — POCT GLYCOSYLATED HEMOGLOBIN (HGB A1C): Hemoglobin A1C: 7.7 % — AB (ref 4.0–5.6)

## 2020-03-14 ENCOUNTER — Other Ambulatory Visit: Payer: Self-pay | Admitting: Internal Medicine

## 2020-03-18 MED ORDER — GLIPIZIDE 5 MG PO TABS: 5.0000 mg | ORAL_TABLET | Freq: Two times a day (BID) | ORAL | 0 refills | Status: AC

## 2020-03-18 MED ORDER — METFORMIN HCL 1000 MG PO TABS: 1000.0000 mg | ORAL_TABLET | Freq: Two times a day (BID) | ORAL | 0 refills | Status: AC

## 2020-03-18 NOTE — Addendum Note (Signed)
Addended by: Roena Malady on: 03/18/2020 04:49 PM   Modules accepted: Orders

## 2020-03-28 ENCOUNTER — Other Ambulatory Visit: Payer: Self-pay | Admitting: Internal Medicine

## 2020-05-17 ENCOUNTER — Other Ambulatory Visit: Payer: Self-pay | Admitting: Internal Medicine

## 2020-06-05 DIAGNOSIS — K219 Gastro-esophageal reflux disease without esophagitis: Secondary | ICD-10-CM | POA: Diagnosis not present

## 2020-06-05 DIAGNOSIS — I10 Essential (primary) hypertension: Secondary | ICD-10-CM | POA: Diagnosis not present

## 2020-06-05 DIAGNOSIS — R0989 Other specified symptoms and signs involving the circulatory and respiratory systems: Secondary | ICD-10-CM | POA: Diagnosis not present

## 2020-06-05 DIAGNOSIS — E1165 Type 2 diabetes mellitus with hyperglycemia: Secondary | ICD-10-CM | POA: Diagnosis not present

## 2020-06-08 DIAGNOSIS — E1165 Type 2 diabetes mellitus with hyperglycemia: Secondary | ICD-10-CM | POA: Diagnosis not present

## 2020-06-12 ENCOUNTER — Other Ambulatory Visit: Payer: Self-pay | Admitting: Internal Medicine

## 2020-06-20 DIAGNOSIS — K760 Fatty (change of) liver, not elsewhere classified: Secondary | ICD-10-CM | POA: Diagnosis not present

## 2020-06-20 DIAGNOSIS — R0989 Other specified symptoms and signs involving the circulatory and respiratory systems: Secondary | ICD-10-CM | POA: Diagnosis not present

## 2020-07-21 ENCOUNTER — Telehealth: Payer: Self-pay

## 2020-07-21 NOTE — Telephone Encounter (Signed)
Patient's wife called stating patient is going to [facility] in [place] instead. FYI

## 2020-08-10 DIAGNOSIS — M25561 Pain in right knee: Secondary | ICD-10-CM | POA: Diagnosis not present

## 2020-08-26 ENCOUNTER — Other Ambulatory Visit: Payer: Self-pay | Admitting: Family

## 2020-08-31 DIAGNOSIS — M25561 Pain in right knee: Secondary | ICD-10-CM | POA: Diagnosis not present

## 2020-09-12 DIAGNOSIS — M25561 Pain in right knee: Secondary | ICD-10-CM | POA: Diagnosis not present

## 2020-09-23 DIAGNOSIS — E1165 Type 2 diabetes mellitus with hyperglycemia: Secondary | ICD-10-CM | POA: Diagnosis not present

## 2020-09-23 DIAGNOSIS — I10 Essential (primary) hypertension: Secondary | ICD-10-CM | POA: Diagnosis not present

## 2020-09-23 DIAGNOSIS — E785 Hyperlipidemia, unspecified: Secondary | ICD-10-CM | POA: Diagnosis not present

## 2020-10-17 DIAGNOSIS — M1711 Unilateral primary osteoarthritis, right knee: Secondary | ICD-10-CM | POA: Diagnosis not present

## 2020-10-17 DIAGNOSIS — S83231A Complex tear of medial meniscus, current injury, right knee, initial encounter: Secondary | ICD-10-CM | POA: Diagnosis not present

## 2020-10-17 DIAGNOSIS — S83281A Other tear of lateral meniscus, current injury, right knee, initial encounter: Secondary | ICD-10-CM | POA: Diagnosis not present

## 2020-10-17 DIAGNOSIS — M948X6 Other specified disorders of cartilage, lower leg: Secondary | ICD-10-CM | POA: Diagnosis not present

## 2020-10-17 DIAGNOSIS — Y999 Unspecified external cause status: Secondary | ICD-10-CM | POA: Diagnosis not present

## 2020-10-17 DIAGNOSIS — X58XXXA Exposure to other specified factors, initial encounter: Secondary | ICD-10-CM | POA: Diagnosis not present

## 2020-10-17 DIAGNOSIS — G8918 Other acute postprocedural pain: Secondary | ICD-10-CM | POA: Diagnosis not present

## 2020-10-17 DIAGNOSIS — M2341 Loose body in knee, right knee: Secondary | ICD-10-CM | POA: Diagnosis not present

## 2020-11-11 DIAGNOSIS — M1612 Unilateral primary osteoarthritis, left hip: Secondary | ICD-10-CM | POA: Diagnosis not present

## 2020-11-22 ENCOUNTER — Other Ambulatory Visit: Payer: Self-pay | Admitting: Family

## 2021-06-30 ENCOUNTER — Other Ambulatory Visit: Payer: Self-pay | Admitting: Internal Medicine

## 2021-06-30 NOTE — Telephone Encounter (Signed)
Requested medication (s) are due for refill today: yes  Requested medication (s) are on the active medication list: yes  Last refill:  11/20/19  Future visit scheduled: no  Notes to clinic:  Unable to refill per protocol, medication not assigned to the refill protocol.    Requested Prescriptions  Pending Prescriptions Disp Refills   Lancets (ONETOUCH DELICA PLUS LANCET33G) MISC [Pharmacy Med Name: ONE TOUCH DELICA PLUS 33G LANC] 100 each 9    Sig: TEST TWICE A DAY AS NEEDED     There is no refill protocol information for this order

## 2021-07-03 NOTE — Telephone Encounter (Signed)
Pt last seen Webb Silversmith NP on 12/10/2019. No future appt scheduled. Sending note to [facility].

## 2024-02-26 ENCOUNTER — Ambulatory Visit: Admitting: Podiatry

## 2024-03-12 ENCOUNTER — Ambulatory Visit: Admitting: Podiatry
